# Patient Record
Sex: Male | Born: 1935 | Race: White | Hispanic: No | Smoking: Never smoker
Health system: Southern US, Community
[De-identification: ages and names within clinical notes are randomized; demographics above are authoritative.]

## PROBLEM LIST (undated history)

## (undated) DIAGNOSIS — I4891 Unspecified atrial fibrillation: Secondary | ICD-10-CM

## (undated) DIAGNOSIS — C679 Malignant neoplasm of bladder, unspecified: Secondary | ICD-10-CM

## (undated) HISTORY — PX: BACK SURGERY: SHX140

## (undated) HISTORY — DX: Unspecified atrial fibrillation: I48.91

---

## 1999-01-02 ENCOUNTER — Ambulatory Visit (HOSPITAL_BASED_OUTPATIENT_CLINIC_OR_DEPARTMENT_OTHER): Admission: RE | Admit: 1999-01-02 | Discharge: 1999-01-02 | Payer: Self-pay | Admitting: Otolaryngology

## 1999-04-01 ENCOUNTER — Encounter: Payer: Self-pay | Admitting: Internal Medicine

## 1999-04-01 ENCOUNTER — Emergency Department (HOSPITAL_COMMUNITY): Admission: EM | Admit: 1999-04-01 | Discharge: 1999-04-01 | Payer: Self-pay | Admitting: Emergency Medicine

## 1999-04-04 ENCOUNTER — Ambulatory Visit (HOSPITAL_COMMUNITY): Admission: RE | Admit: 1999-04-04 | Discharge: 1999-04-04 | Payer: Self-pay | Admitting: Gastroenterology

## 2001-07-14 ENCOUNTER — Ambulatory Visit (HOSPITAL_COMMUNITY): Admission: RE | Admit: 2001-07-14 | Discharge: 2001-07-14 | Payer: Self-pay | Admitting: *Deleted

## 2002-07-07 ENCOUNTER — Ambulatory Visit (HOSPITAL_COMMUNITY): Admission: RE | Admit: 2002-07-07 | Discharge: 2002-07-07 | Payer: Self-pay | Admitting: Neurosurgery

## 2002-07-07 ENCOUNTER — Encounter: Payer: Self-pay | Admitting: Neurosurgery

## 2007-11-19 ENCOUNTER — Encounter: Admission: RE | Admit: 2007-11-19 | Discharge: 2007-11-19 | Payer: Self-pay | Admitting: Internal Medicine

## 2007-11-25 ENCOUNTER — Encounter: Admission: RE | Admit: 2007-11-25 | Discharge: 2007-11-25 | Payer: Self-pay | Admitting: Internal Medicine

## 2008-02-16 ENCOUNTER — Encounter: Admission: RE | Admit: 2008-02-16 | Discharge: 2008-02-16 | Payer: Self-pay | Admitting: Anesthesiology

## 2008-06-02 ENCOUNTER — Inpatient Hospital Stay (HOSPITAL_COMMUNITY): Admission: RE | Admit: 2008-06-02 | Discharge: 2008-06-04 | Payer: Self-pay | Admitting: Neurological Surgery

## 2011-03-12 NOTE — Op Note (Signed)
NAMENATNAEL, BIEDERMAN             ACCOUNT NO.:  [id number]   MEDICAL RECORD NO.:  [id number]          PATIENT TYPE:  INP   LOCATION:  3007                         FACILITY:  [facility]   PHYSICIAN:  Tia Alert, MD     DATE OF BIRTH:  [birth year]   DATE OF PROCEDURE:  DATE OF DISCHARGE:                               OPERATIVE REPORT   PREOPERATIVE DIAGNOSIS:  Lumbar wound hematoma status post three-level  decompressive lumbar laminectomy.   POSTOPERATIVE DIAGNOSIS:  Lumbar wound hematoma status post three-level  decompressive lumbar laminectomy.   PROCEDURE:  Lumbar re-exploration with evacuation of superficial wound  hematoma.   SURGEON:  Tia Alert, MD   ANESTHESIA:  General endotracheal.   COMPLICATIONS:  None apparent.   INDICATIONS FOR THE PROCEDURE:  Mr. Ballentine underwent a three-level  laminectomy earlier this morning.  When he got to his room, he was noted  to have some swelling and some leakage of blood around his large Hemovac  drain that was placed.  There seemed to be bleeding around the drain  itself and he was developing swelling in the subcutaneous tissues that  was ballottable.  I recommended a lumbar re-exploration with removal of  what I suspected to be a superficial hematoma.  He had no neurologic  problems at that time with no evidence of weakness or numbness and no  real leg pain other than some mild cramping of the calves.  He  understood the risks versus benefits, expected outcome, and wished to  proceed.   DESCRIPTION OF PROCEDURE:  The patient was taken to the operating room,  and after induction of adequate general endotracheal anesthesia, he was  rolled in the prone position on chest rolls and all pressure points were  padded.  His lumbar region was prepped with DuraPrep and then draped in  the usual sterile fashion.  The old Hemovac drain was removed and the  Steri-Strips were removed just prior to prepping.  The incision was  opened and  sutures were removed.  There was a large superficial hematoma  that was suprafascial, it did go into the subfascial space, but not all  the way to the dura.  The Gelfoam was removed.  The dura looked good.  I  was able to coagulate a large amount of bleeding from the entrance hole  where the drain went into the muscle and I think this it was the source  of the bleeding, was the drain pathway.  This was coagulated.  I then  spent considerable time just drying up any muscular bleeding that I  found, waxing bone edges and making sure that I did not have significant  epidural bleeding.  I then irrigated with saline solution containing  bacitracin, lined the dura with Gelfoam once again, then spent more  considerable time drying the muscle.  I then closed the muscle with  interrupted 0 Vicryl after two medium Hemovac drains were placed in the  epidural space.  I then closed the fascia with interrupted 0 Vicryl  closing subcutaneous and subcuticular tissue with 2-0 and 3-0 Vicryl and  the skin  with Benzoin and Steri-Strips.  Drapes were removed.  A sterile dressing was applied.  The patient was  awakened from general anesthesia and transferred to the recovery room in  stable condition.  At the end of this procedure, all sponge, needle, and  instrument counts were correct.      Tia Alert, MD  Electronically Signed     DSJ/MEDQ  D:  06/02/2008  T:  06/03/2008  Job:  [id number]

## 2011-03-12 NOTE — Op Note (Signed)
Terry Sullivan, Terry Sullivan             ACCOUNT NO.:  [id number]   MEDICAL RECORD NO.:  [id number]          PATIENT TYPE:  INP   LOCATION:  3007                         FACILITY:  [facility]   PHYSICIAN:  Tia Alert, MD     DATE OF BIRTH:  December 28, 1923   DATE OF PROCEDURE:  06/02/2008  DATE OF DISCHARGE:                               OPERATIVE REPORT   PREOPERATIVE DIAGNOSIS:  Lumbar spinal stenosis L2-3, L3-4, and L4-5  with back and leg pain.   POSTOPERATIVE DIAGNOSIS:  Lumbar spinal stenosis L2-3, L3-4, and L4-5  with back and leg pain.   PROCEDURE:  Lumbar decompressive laminectomy L2-3, L3-4 and L4-5 with  medial facetectomies and foraminotomies for decompression of the L2, L3,  L4 and L5 nerve roots bilaterally.   SURGEON:  Tia Alert, MD   ASSISTANT:  Donalee Citrin, MD   ANESTHESIA:  General endotracheal.   COMPLICATIONS:  None apparent.   INDICATIONS FOR THE PROCEDURE:  Terry Sullivan is an 77 year old gentleman  who presented with severe back pain with bilateral leg pain limiting his  ability to ambulate.  He had an MRI which showed severe spondylosis with  degenerative disk disease with neural foraminal stenosis and significant  lateral recess stenosis and central canal stenosis and recommended  lumbar decompressive laminectomy at L2 through L3-4, and L4-5.  He  understood the risks, benefits, expected outcome and wished to proceed.   DESCRIPTION OF PROCEDURE:  The patient was taken to operating room and  after induction of adequate generalized endotracheal anesthesia he was  rolled into prone position on the Wilson frame and all pressure points  were padded.  His lumbar region was prepped with DuraPrep and then  draped in the usual sterile fashion.  Local anesthesia 10 mL was  injected and a dorsal midline incision was made and carried down to the  lumbosacral fascia.  The fascia was opened and the paraspinous  musculature was taken down in subperiosteal fashion to expose  L2-3, L3-  4, and L4-5.  Intraoperative x-ray confirmed my level and then I used  the Leksell rongeur to remove the spinous process of L2, L3, and L4 and  then used the Kerrison punch and a high-speed drill to perform  laminectomy and medial facetectomies at L2-3, L3-4, and L4-5.  He had  significant lateral recess stenosis at L2-3 from ligamentum flavum  overgrowth of facet hypertrophy.  He had significant lateral recess  stenosis at L3-4 and L4-5 on the right side from ligamentum flavum  hypertrophy and facet hypertrophy.  He had scar tissue down over the L5  nerve root from his previous surgery.  I spent considerable time  decompressing under the lateral recess undercutting the facets with the  Kerrison punches removing yellow ligament and overgrown facet of S1  along and identified the L2, L3, L4 and L5 nerve roots bilaterally and  had them well-decompressed into foramina as distal as I could go without  performing facetectomies.  I then palpated with a coronary dilator along  each nerve root to assure adequate compression.  I could feel the  pedicle at  each level and feel along the nerve root.  The nerve roots  were visualized.  I felt like I had a good decompression.  He did have  some scoliosis and significant facet arthrosis.  What I felt like I had  a good central canal decompression and a good decompression of the L2  and L3 nerve roots on the right and the L2 and L3 nerve roots on the  left and the L3, L4, and L5 nerve roots on the right which was my goal  prior to surgery.  I then irrigated with saline solution and dried all  bleeding points.  I used Surgifoam.  I irrigated this away.  I then  lined the dura with Gelfoam.  I placed a large Hemovac drain through a  separate stab incision, dried the obvious bleeding points and then  closed the muscle and the fascia with 0 Vicryl closing subcutaneous and  subcuticular tissue with 2-0 and 3-0 Vicryl and closed the skin with   Benzoin and Steri-Strips.  The drapes were removed.  A sterile dressing  was applied.  The patient was awakened from general anesthesia and  transferred to recovery room in stable condition.  At the end of the  procedure, all sponge, needle and instrument counts were correct.      Tia Alert, MD  Electronically Signed     DSJ/MEDQ  D:  06/02/2008  T:  06/02/2008  Job:  [id number]

## 2011-03-15 NOTE — Discharge Summary (Signed)
Terry Sullivan, Terry Sullivan             ACCOUNT NO.:  [id number]   MEDICAL RECORD NO.:  [id number]          PATIENT TYPE:  INP   LOCATION:  3007                         FACILITY:  [facility]   PHYSICIAN:  Tia Alert, MD     DATE OF BIRTH:  [birth year]   DATE OF ADMISSION:  06/02/2008  DATE OF DISCHARGE:  06/04/2008                               DISCHARGE SUMMARY   ADMITTING DIAGNOSIS:  Lumbar spinal stenosis.   PROCEDURE:  Decompressive laminectomy for lumbar spinal stenosis.   SECONDARY PROCEDURE:  Re-exploration for wound hematoma.   HISTORY OF PRESENT ILLNESS:  Terry Sullivan is an 77 year old gentleman who  presented with back and bilateral leg pain.  He had an MRI which showed  severe spinal stenosis at L2-3, L3-4, and L4-5.  I recommended  decompressive laminectomies at each level for central canal nerve root  decompression.  He understood the risks, benefits, and expected outcome  and wished to proceed.   HOSPITAL COURSE:  The patient was admitted on June 02, 2008 and taken  to operating room where he underwent a 3-level decompressive  laminectomy.  A Hemovac drain was placed and he initially did well with  the surgery.  However, he had bleeding around Hemovac and had  significant bleeding from his incision.  He started have swelling from  the incision.  We suspected a wound hematoma.  He was taken back to the  operating room later that afternoon for wound exploration and evacuation  of the hematoma.  For details of the operative procedures, please see  the dictated operative notes.  The patient tolerated the procedure well.  He was taken to the recovery room and into the floor in stable  condition.  He did have some urinary difficulties after surgery  requiring in-and-out catheterization every 6 hours.  He had no leg pain,  numbness, tingling, or weakness.  He had good strength in his lower  extremities.  He was ambulating well.  He continued to do better with  his urination, to  the point, that on June 04, 2008, he was stable for  discharge home.  He then was able to void 250 mL but had a postvoid  residual of 460 mL.  It was then felt that a catheter could be placed  and he can be discharged home with a leg bag and a Foley.  The patient  desired discharge home and therefore, we asked for followup with the  urologist as an outpatient for removal of the Foley catheter.  The  patient was discharged home in stable condition on June 04, 2008, with  plans for followup with Dr. Yetta Barre in 2 weeks.   FINAL DIAGNOSIS:  Decompressive laminectomy for lumbar spinal stenosis.      Tia Alert, MD  Electronically Signed     DSJ/MEDQ  D:  07/06/2008  T:  07/07/2008  Job:  [id number] [id number]

## 2011-07-26 LAB — CBC
RBC: 4.28
WBC: 4.1

## 2011-07-26 LAB — DIFFERENTIAL
Basophils Relative: 1
Lymphocytes Relative: 21
Lymphs Abs: 0.9
Monocytes Relative: 6
Neutro Abs: 2.8
Neutrophils Relative %: 69

## 2011-07-26 LAB — BASIC METABOLIC PANEL
Calcium: 8.9
Creatinine, Ser: 0.98
GFR calc Af Amer: >60
GFR calc non Af Amer: >60
Sodium: 139

## 2011-07-26 LAB — APTT: aPTT: 35

## 2011-07-26 LAB — PROTIME-INR: INR: 1.1

## 2011-11-11 ENCOUNTER — Other Ambulatory Visit: Payer: Self-pay | Admitting: Internal Medicine

## 2011-11-11 ENCOUNTER — Ambulatory Visit
Admission: RE | Admit: 2011-11-11 | Discharge: 2011-11-11 | Disposition: A | Discharge status: Home / Self Care | Payer: Medicare Other | Source: Ambulatory Visit | Attending: Internal Medicine | Admitting: Internal Medicine

## 2011-11-11 DIAGNOSIS — R109 Unspecified abdominal pain: Secondary | ICD-10-CM

## 2011-11-11 MED ORDER — IOHEXOL 300 MG/ML  SOLN: 30.0000 mL | Freq: Once | INTRAMUSCULAR | Status: DC | PRN

## 2011-11-11 MED ORDER — IOHEXOL 300 MG/ML  SOLN
100.0000 mL | Freq: Once | INTRAMUSCULAR | Status: AC | PRN
  Administered 2011-11-11: 100 mL via INTRAVENOUS

## 2011-11-11 MED ORDER — IOHEXOL 300 MG/ML  SOLN
30.0000 mL | Freq: Once | INTRAMUSCULAR | Status: AC | PRN
  Administered 2011-11-11: 30 mL via ORAL

## 2013-10-27 ENCOUNTER — Other Ambulatory Visit: Payer: Self-pay | Admitting: Internal Medicine

## 2013-10-27 ENCOUNTER — Ambulatory Visit
Admission: RE | Admit: 2013-10-27 | Discharge: 2013-10-27 | Disposition: A | Discharge status: Home / Self Care | Payer: Medicare Other | Source: Ambulatory Visit | Attending: Internal Medicine | Admitting: Internal Medicine

## 2013-10-27 DIAGNOSIS — R05 Cough: Secondary | ICD-10-CM

## 2014-02-14 ENCOUNTER — Other Ambulatory Visit: Payer: Self-pay | Admitting: Ophthalmology

## 2016-06-05 ENCOUNTER — Telehealth: Payer: Self-pay

## 2016-06-05 NOTE — Telephone Encounter (Signed)
DOD call from Dr. Deforest Hoyles [place]), patient's PCP. Patient has new Atrial Fib. Dr. Deforest Hoyles is starting patient on blood thinner and checking lab work. Patient needs a new patient appointment for new Atrial Fib.

## 2016-06-14 ENCOUNTER — Ambulatory Visit: Payer: Medicare Other | Admitting: Cardiovascular Disease

## 2016-07-15 ENCOUNTER — Ambulatory Visit: Payer: Self-pay | Admitting: Cardiovascular Disease

## 2017-04-08 ENCOUNTER — Other Ambulatory Visit: Payer: Self-pay | Admitting: Physical Medicine and Rehabilitation

## 2017-04-08 DIAGNOSIS — M545 Low back pain: Secondary | ICD-10-CM

## 2017-04-19 ENCOUNTER — Ambulatory Visit
Admission: RE | Admit: 2017-04-19 | Discharge: 2017-04-19 | Disposition: A | Discharge status: Home / Self Care | Payer: Medicare Other | Source: Ambulatory Visit | Attending: Physical Medicine and Rehabilitation | Admitting: Physical Medicine and Rehabilitation

## 2017-04-19 DIAGNOSIS — M545 Low back pain: Secondary | ICD-10-CM

## 2017-04-19 MED ORDER — GADOBENATE DIMEGLUMINE 529 MG/ML IV SOLN
15.0000 mL | Freq: Once | INTRAVENOUS | Status: AC | PRN
  Administered 2017-04-19: 14 mL via INTRAVENOUS

## 2018-12-25 DIAGNOSIS — M19041 Primary osteoarthritis, right hand: Secondary | ICD-10-CM | POA: Insufficient documentation

## 2018-12-25 DIAGNOSIS — M65341 Trigger finger, right ring finger: Secondary | ICD-10-CM | POA: Insufficient documentation

## 2018-12-25 DIAGNOSIS — M19042 Primary osteoarthritis, left hand: Secondary | ICD-10-CM | POA: Insufficient documentation

## 2019-03-08 ENCOUNTER — Telehealth: Payer: Self-pay | Admitting: Oncology

## 2019-03-08 NOTE — Telephone Encounter (Signed)
Received a new patient referral from Dr. [name] at [facility] for a bladder tumor. Pt has been cld and scheduled to see Dr. [name] on 5/15 at 2pm. Pt aware to arrive 15 minutes early and aware of the no visitor policy.

## 2019-03-12 ENCOUNTER — Inpatient Hospital Stay: Payer: Medicare Other | Attending: Oncology | Admitting: Oncology

## 2019-03-12 ENCOUNTER — Other Ambulatory Visit: Payer: Self-pay

## 2019-03-12 VITALS — BP 118/66 | HR 77 | Temp 98.7°F | Resp 17 | Ht 67.0 in | Wt 159.4 lb

## 2019-03-12 DIAGNOSIS — C679 Malignant neoplasm of bladder, unspecified: Secondary | ICD-10-CM

## 2019-03-12 DIAGNOSIS — I4891 Unspecified atrial fibrillation: Secondary | ICD-10-CM | POA: Diagnosis not present

## 2019-03-12 DIAGNOSIS — Z7901 Long term (current) use of anticoagulants: Secondary | ICD-10-CM | POA: Diagnosis not present

## 2019-03-12 DIAGNOSIS — M549 Dorsalgia, unspecified: Secondary | ICD-10-CM

## 2019-03-12 DIAGNOSIS — N1330 Unspecified hydronephrosis: Secondary | ICD-10-CM

## 2019-03-12 DIAGNOSIS — F5089 Other specified eating disorder: Secondary | ICD-10-CM

## 2019-03-12 DIAGNOSIS — G8929 Other chronic pain: Secondary | ICD-10-CM

## 2019-03-12 NOTE — Progress Notes (Signed)
Reason for the request:    Bladder cancer  HPI: I was asked by Dr. [name] to evaluate Terry Sullivan for new finding of a bladder tumor.  He is a 85 year old man with history of atrial fibrillation who presented with gross hematuria referred by Dr. Laurann Montana to Dr. [name].  He underwent a CT scan of the abdomen and pelvis done on May 5 of 2020 which showed a large irregular bladder mass with probable extension beyond the bladder margin in the perivesicular space.  It also extends along the left Novick sidewall with probable invasion into the left seminal vesicle.  Bladder mass involves the left UVJ with mild left hydronephrosis.  No evidence of metastatic disease noted at the time.  He underwent a cystoscopy performed in the office which showed severe trabeculation of the bladder with left lateral wall tumor measuring more than 3 cm.  No biopsies were obtained.  Based on these findings he was referred to me for evaluation.  Clinically, Terry Sullivan has reported few complaints.  Including continued urinary incontinence although no hematuria.  He also reported some abdominal distention and discomfort at times with anorexia.  He has lost 2 pounds but still able to eat.  He still ambulating with the help of a walker without any recent falls or syncope.  Formal status appears to be slowly declining.  He is daughter is currently staying with him for the last few weeks and he is looking to have more in-home help moving forward.  He does report chronic pain in his lower back which is related to arthritis and unchanged as of late.  He does not report any headaches, blurry vision, syncope or seizures. Does not report any fevers, chills or sweats.  Does not report any cough, wheezing or hemoptysis.  Does not report any chest pain, palpitation, orthopnea or leg edema.  Does not report any nausea, vomiting or abdominal pain.  Does not report any constipation or diarrhea.  Does not report any pathological fractures or changes  in his bone pain. Does not report any skin rashes or lesions. Does not report any heat or cold intolerance.  Does not report any lymphadenopathy or petechiae.  Does not report any anxiety or depression.  Remaining review of systems is negative.    Past medical history significant for atrial fibrillation, arthritis as well as status post back surgery.   Medication: Zyrtec Eliquis gabapentin, OxyContin and Flonase.    No known allergies.    Social History   Socioeconomic History  . Marital status: Married    Spouse name: Not on file  . Number of children: Not on file  . Years of education: Not on file  . Highest education level: Not on file  Occupational History  . Not on file  Social Needs  . Financial resource strain: Not on file  . Food insecurity:    Worry: Not on file    Inability: Not on file  . Transportation needs:    Medical: Not on file    Non-medical: Not on file  Tobacco Use  . Smoking status: Not on file  Substance and Sexual Activity  . Alcohol use: Not on file  . Drug use: Not on file  . Sexual activity: Not on file  Lifestyle  . Physical activity:    Days per week: Not on file    Minutes per session: Not on file  . Stress: Not on file  Relationships  . Social connections:    Talks on phone:  Not on file    Gets together: Not on file    Attends religious service: Not on file    Active member of club or organization: Not on file    Attends meetings of clubs or organizations: Not on file    Relationship status: Not on file  . Intimate partner violence:    Fear of current or ex partner: Not on file    Emotionally abused: Not on file    Physically abused: Not on file    Forced sexual activity: Not on file  Other Topics Concern  . Not on file  Social History Narrative  . Not on file  :  Pertinent items are noted in HPI.  Exam: Blood pressure 118/66, pulse 77, temperature 98.7 F (37.1 C), temperature source Oral, resp. rate 17, height 5\' 7"   (1.702 m), weight 159 lb 6.4 oz (72.3 kg), SpO2 97 %. ECOG 2  General appearance: alert and cooperative appeared without distress. Head: atraumatic without any abnormalities. Eyes: conjunctivae/corneas clear. PERRL.  Sclera anicteric. Throat: lips, mucosa, and tongue normal; without oral thrush or ulcers. Resp: clear to auscultation bilaterally without rhonchi, wheezes or dullness to percussion. Cardio: regular rate and rhythm, S1, S2 normal, no murmur, click, rub or gallop GI: soft, non-tender; bowel sounds normal; no masses,  no organomegaly Skin: Skin color, texture, turgor normal. No rashes or lesions Lymph nodes: Cervical, supraclavicular, and axillary nodes normal. Neurologic: Grossly normal without any motor, sensory or deep tendon reflexes. Musculoskeletal: No joint deformity or effusion.  CBC    Component Value Date/Time   WBC 4.1 05/30/2008 1007   RBC 4.28 05/30/2008 1007   HGB 12.6 (L) 05/30/2008 1007   HCT 37.7 (L) 05/30/2008 1007   PLT 129 (L) 05/30/2008 1007   MCV 88.3 05/30/2008 1007   MCHC 33.4 05/30/2008 1007   RDW 16.6 (H) 05/30/2008 1007   LYMPHSABS 0.9 05/30/2008 1007   MONOABS 0.2 05/30/2008 1007   EOSABS 0.1 05/30/2008 1007   BASOSABS 0.0 05/30/2008 1007     Chemistry      Component Value Date/Time   NA 139 05/30/2008 1007   K 4.3 05/30/2008 1007   CL 110 05/30/2008 1007   CO2 24 05/30/2008 1007   BUN 23 05/30/2008 1007   CREATININE 0.98 05/30/2008 1007      Component Value Date/Time   CALCIUM 8.9 05/30/2008 1007       Assessment and Plan:   85 year old man with the following:  1.  Bladder tumor diagnosed in May 2020.  He presented with hematuria and a CT scan showed a large mass extending along the upper anterior left part of the urinary bladder measuring 2.7 cm in thickness with soft tissue thickening tracking along the radial margin and the perivesical space.  No local adenopathy noted at this time.  The nature of these findings and MD  differential diagnosis was reviewed today.  These findings are suspicious for locally advanced bladder cancer indicating at least T4 disease without any evidence of adenopathy although CT scan was obtained without contrast.  Transitional cell carcinoma of the bladder is the most likely pathology but other etiologies including squamous cell carcinoma or adenocarcinoma would be possibilities.  Lymphoproliferative disorder such as lymphoma would be considered less likely.  To complete the work-up he will require a full body PET CT scan to fully evaluate these findings as well as tissue biopsy.  Obtaining tissue biopsy with a TURBT if it can be done would be ideal but if  not, percutaneous biopsy would be an alternative option.  Once these findings are available the treatment plan cannot be outlined more clearly.  Options of therapy were reviewed and I feel he has local regional disease only, would favor radiation concomitantly with chemotherapy for disease control as the preferred modality.  He has widespread disease, then palliative chemotherapy versus immunotherapy versus supportive care only would be his basis.  At this time I will arrange for PET scan in the immediate future and also will discuss with Dr. [name] about obtaining tissue biopsy.  If that cannot be done, then percutaneous biopsy would be the next step.  I will also refer him to radiation oncology for an evaluation.  He would be a marginal candidate for combination chemotherapy but would be a reasonable candidate for low-dose chemotherapy with palliative radiation approach.  2.  Chronic anticoagulation with Eliquis: I have no objections to continuing.  This can be held for any procedure for at least 2 to 3 days.  3.  Hydronephrosis: We will assess his kidney function prior to any attempt for chemotherapy.  4.  Pain: His pain is predominantly in his back which is chronic in nature.  Although he is experiencing some abdominal discomfort  related to his tumor.  5.  Anorexia: We have discussed strategies to boost his appetite including using nutritional supplements.  6.  Goals of care and prognosis: I do not believe his disease is curable at this time.  Any treatment at this time would be likely palliative to control his disease and prevent future symptoms.  Complications such as hematuria and pain would be a possibility if his disease left untreated.   7.  Follow-up: We will be determined depending on results of his work-up.  60  minutes was spent with the patient face-to-face today.  More than 50% of time was dedicated to reviewing his disease status, imaging studies, treatment options and discussing future plan of care with his daughter via phone.   Thank you for the referral. A copy of this consult has been forwarded to the requesting physician.

## 2019-03-15 NOTE — Progress Notes (Signed)
GU Location of Tumor / Histology: bladder cancer  Terry Sullivan presented to his PCP, Dr. Laurann Montana, with gross hematuria. Dr. Laurann Montana referred patient to Dr. [name] who then referred patient to Dr. [name].   Biopsy scheduled for Thursday, Mar 18, 2019 at 0715  TURBT hasn't been scheduled yet  Past/Anticipated interventions by urology, if any: referral to Dr. [name]  Past/Anticipated interventions by medical oncology, if any: Consulted, Ordered PET (scheduled for 5/27), referred patient to Dr. [name]   Weight changes, if any: denies  Bowel/Bladder complaints, if any: IPSS 8. Continued urinary incontinence, abdominal distention with discomfort and anorexia. Denies dysuria. Reports urgency seems less than it was a few weeks ago. Denies hematuria "lately." Reports wearing depend OTC.  Nausea/Vomiting, if any: denies  Pain issues, if any:  Chronic low back pain related to arthritis and two spine surgeries. Reports dull abdominal discomfort present since late April.   SAFETY ISSUES:  Prior radiation? denies  Pacemaker/ICD? denies  Possible current pregnancy? no, male patient  Is the patient on methotrexate? denies  Current Complaints / other details:  85 year old male. Married. NKDA. Has Afib and taking Eliquis. Reports feeling weaker these last few weeks.

## 2019-03-16 ENCOUNTER — Other Ambulatory Visit: Payer: Self-pay | Admitting: Cardiology

## 2019-03-16 ENCOUNTER — Encounter: Payer: Self-pay | Admitting: Radiation Oncology

## 2019-03-16 ENCOUNTER — Other Ambulatory Visit: Payer: Self-pay | Admitting: Radiology

## 2019-03-16 ENCOUNTER — Other Ambulatory Visit: Payer: Self-pay

## 2019-03-16 ENCOUNTER — Ambulatory Visit
Admission: RE | Admit: 2019-03-16 | Discharge: 2019-03-16 | Disposition: A | Discharge status: Home / Self Care | Payer: Medicare Other | Source: Ambulatory Visit | Attending: Radiation Oncology | Admitting: Radiation Oncology

## 2019-03-16 ENCOUNTER — Ambulatory Visit (HOSPITAL_COMMUNITY)
Admission: RE | Admit: 2019-03-16 | Discharge: 2019-03-16 | Disposition: A | Discharge status: Home / Self Care | Payer: Medicare Other | Source: Ambulatory Visit | Attending: Oncology | Admitting: Oncology

## 2019-03-16 VITALS — Ht 67.0 in | Wt 159.0 lb

## 2019-03-16 DIAGNOSIS — C673 Malignant neoplasm of anterior wall of bladder: Secondary | ICD-10-CM

## 2019-03-16 DIAGNOSIS — Z79899 Other long term (current) drug therapy: Secondary | ICD-10-CM | POA: Diagnosis not present

## 2019-03-16 DIAGNOSIS — C679 Malignant neoplasm of bladder, unspecified: Secondary | ICD-10-CM | POA: Diagnosis present

## 2019-03-16 HISTORY — DX: Malignant neoplasm of bladder, unspecified: C67.9

## 2019-03-16 LAB — GLUCOSE, CAPILLARY: Glucose-Capillary: 105 mg/dL — ABNORMAL HIGH (ref 70–99)

## 2019-03-16 MED ORDER — FLUDEOXYGLUCOSE F - 18 (FDG) INJECTION
7.9600 | Freq: Once | INTRAVENOUS | Status: AC | PRN
  Administered 2019-03-16: 08:00:00 7.96 via INTRAVENOUS

## 2019-03-16 NOTE — Progress Notes (Signed)
Radiation Oncology         [phone] ________________________________  Initial Outpatient Consultation - Conducted via WebEx due to current COVID-19 concerns for limiting patient exposure  Name: Terry Sullivan MRN: [id number]  Date of Service: 03/16/2019 DOB: [birth year]  [id number], Terry Reichmann, MD  Wyatt Portela, MD   REFERRING PHYSICIAN: Wyatt Portela, MD  DIAGNOSIS: 85 y.o. 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 Malignant neoplasm of anterior wall of urinary bladder ([place]) C67.3     HISTORY OF PRESENT ILLNESS: Terry Sullivan is a 85 y.o. male seen at the request of Dr. [name]. He initially presented to his PCP with worsening gross hematuria.  He was treated for potential UTI with a course of Cipro and the hematuria did temporarily improve.  However, the gross hematuria returned shortly after completing the Cipro and he was then referred to urology for further evaluation.  He initially met with Jiles Crocker, NP and underwent a CT scan of the abdomen/pelvis on 03/02/2019. This showed a large, irregular bladder mass with probable extension beyond the bladder margins in the prevesical space along the urachus, and along the left pelvic sidewall with probably invasion of the left seminal vesicle. The bladder mass appeared to involve the left UVJ, causing mild left hydronephrosis. Otherwise, no overt adenopathy or definite metastatic lesions were noted.  He met with Dr. [name] in consultation on 03/04/2019 and underwent an in-office cystoscopy which showed severe trabeculation of the bladder with left lateral wall tumor measuring more than 3 cm. The appearance was most consistent with an aggressive transitional cell carcinoma with extension into the left seminal vesicle and left ureter consistent with locally advanced disease.  Dr. [name] felt that the risks associated with TURBT outweighed the potential small  benefit as this would not have any significant impact on long-term control of his disease or improve his overall survival.  He was therefore referred to Dr. [name] for consideration of palliative chemotherapy in the management of his disease.    He met in consult with Dr. [name] on 03/12/2019 and the recommendation was to proceed with a PET scan for further assessment of the extent of metastatic disease and consideration for bladder biopsy for tissue confirmation.  If his disease is found to be locally advanced only and there is no evidence for diffuse metastasis, he is felt to be a suitable candidate for concurrent chemoradiation with the intent of achieving more durable, long-term control of his disease.  The patient understands that this is unlikely to cure his disease and would be used as a palliative treatment approach.     PET scan was performed earlier today and confirmed the known large mass involving the urinary bladder and invading the surrounding ventral, left lateral, and left posterior soft tissues as well as a single borderline left pelvic sidewall lymph node measuring 1 cm and exhibiting mild increased uptake with SUV max of 4.33.  There were no additional enlarged or FDG avid lymph nodes within the neck, chest, abdomen, or pelvis to indicate distant metastasis.  He is scheduled for CT guided bladder biopsy on Thursday May 21st.  The patient has kindly been referred today for discussion of potential radiation treatment options.  His son, Zenia Resides, and daughter are present with him during our discussion today  PREVIOUS RADIATION THERAPY: No  PAST MEDICAL HISTORY:  Past Medical History:  Diagnosis Date  . Bladder cancer ([place])  PAST SURGICAL HISTORY: Past Surgical History:  Procedure Laterality Date  . BACK SURGERY      FAMILY HISTORY:  Family History  Problem Relation Age of Onset  . Hodgkin's lymphoma Mother   . Bladder Cancer Father   . Cancer Brother     SOCIAL HISTORY:   Social History   Socioeconomic History  . Marital status: Married    Spouse name: Not on file  . Number of children: Not on file  . Years of education: Not on file  . Highest education level: Not on file  Occupational History  . Not on file  Social Needs  . Financial resource strain: Not on file  . Food insecurity:    Worry: Not on file    Inability: Not on file  . Transportation needs:    Medical: Not on file    Non-medical: Not on file  Tobacco Use  . Smoking status: Never Smoker  . Smokeless tobacco: Never Used  Substance and Sexual Activity  . Alcohol use: Not Currently  . Drug use: Never  . Sexual activity: Not Currently  Lifestyle  . Physical activity:    Days per week: Not on file    Minutes per session: Not on file  . Stress: Not on file  Relationships  . Social connections:    Talks on phone: Not on file    Gets together: Not on file    Attends religious service: Not on file    Active member of club or organization: Not on file    Attends meetings of clubs or organizations: Not on file    Relationship status: Not on file  . Intimate partner violence:    Fear of current or ex partner: Not on file    Emotionally abused: Not on file    Physically abused: Not on file    Forced sexual activity: Not on file  Other Topics Concern  . Not on file  Social History Narrative  . Not on file    ALLERGIES: Patient has no known allergies.  MEDICATIONS:  Current Outpatient Medications  Medication Sig Dispense Refill  . acetaminophen (TYLENOL) 500 MG tablet Take 500 mg by mouth 2 (two) times daily.    . cetirizine (ZYRTEC) 10 MG tablet Take by mouth as needed.    . doxazosin (CARDURA) 8 MG tablet Take 4 mg by mouth daily.    . fluticasone (FLONASE) 50 MCG/ACT nasal spray INSTILL 2 SPRAYS INTO EACH NOSTRIL ONCE DAILY IF NEEDED    . gabapentin (NEURONTIN) 100 MG capsule Take 1 capsule by mouth 2 (two) times daily.    . OXYCONTIN 10 MG 12 hr tablet Take 1 tablet by  mouth 2 (two) times daily.    Marland Kitchen tobramycin-dexamethasone (TOBRADEX) ophthalmic solution Instill one drop OD QID x 5 days    . ELIQUIS 2.5 MG TABS tablet TAKE 1 TABLET BY MOUTH TWICE DAILY (Patient not taking: Reported on 03/16/2019) 60 tablet 6   No current facility-administered medications for this encounter.     REVIEW OF SYSTEMS:  On review of systems, the patient reports that he is doing well overall. He denies any chest pain, shortness of breath, cough, fevers, chills, night sweats, or unintended weight changes. He reports feeling weaker over the past few weeks. He denies any bowel disturbances, and denies nausea or vomiting. His IPSS score was 8, indicating moderate urinary symptoms. He reports continued urinary incontinence, requiring Depends, and abdominal distension with dull discomfort in the lower abdomen since  the time of his cystoscopy. He denies dysuria or recent hematuria and reports that his urinary urgency is gradually improving. He reports chronic low back pain related to arthritis and two spine surgeries, but denies any new musculoskeletal or joint aches or pains. A complete review of systems is obtained and is otherwise negative.    PHYSICAL EXAM:  Wt Readings from Last 3 Encounters:  03/16/19 159 lb (72.1 kg)  03/12/19 159 lb 6.4 oz (72.3 kg)   Temp Readings from Last 3 Encounters:  03/12/19 98.7 F (37.1 C) (Oral)   BP Readings from Last 3 Encounters:  03/12/19 118/66   Pulse Readings from Last 3 Encounters:  03/12/19 77   Pain Assessment Pain Score: 2  Pain Frequency: Constant Pain Loc: Abdomen/10  In general this is a well appearing caucasian male in no acute distress. He is alert and oriented x4 and appropriate throughout the examination. Cardiopulmonary assessment is negative for acute distress and he exhibits normal effort. Remainder of exam not performed in light of virtual consultation platform.   KPS = 80  100 - Normal; no complaints; no evidence of  disease. 90   - Able to carry on normal activity; minor signs or symptoms of disease. 80   - Normal activity with effort; some signs or symptoms of disease. 33   - Cares for self; unable to carry on normal activity or to do active work. 60   - Requires occasional assistance, but is able to care for most of his personal needs. 50   - Requires considerable assistance and frequent medical care. 58   - Disabled; requires special care and assistance. 52   - Severely disabled; hospital admission is indicated although death not imminent. 51   - Very sick; hospital admission necessary; active supportive treatment necessary. 10   - Moribund; fatal processes progressing rapidly. 0     - Dead  Karnofsky DA, Abelmann [place], Craver LS and Burchenal [facility] [phone]) The use of the nitrogen mustards in the palliative treatment of carcinoma: with particular reference to bronchogenic carcinoma Cancer 1 [id number]  LABORATORY DATA:  Lab Results  Component Value Date   WBC 4.1 05/30/2008   HGB 12.6 (L) 05/30/2008   HCT 37.7 (L) 05/30/2008   MCV 88.3 05/30/2008   PLT 129 (L) 05/30/2008   Lab Results  Component Value Date   NA 139 05/30/2008   K 4.3 05/30/2008   CL 110 05/30/2008   CO2 24 05/30/2008   No results found for: ALT, AST, GGT, ALKPHOS, BILITOT   RADIOGRAPHY: No results found.    IMPRESSION/PLAN: 1. 85 y.o. male with locally advanced, high-grade, muscle invasive urothelial carcinoma of the bladder.  Today, we talked to the patient and family about the findings and workup thus far. We discussed the natural history of locally advanced, high grade bladder cancer and general treatment with concurrent chemoradiation. We discussed the available radiation techniques, and focused on the details of logistics and delivery. We discussed radiation treatments directed to the bladder and surrounding lymph nodes.  The recommendation is to proceed with a 7-week course of daily radiotherapy concurrent with weekly  systemic chemotherapy.  We reviewed the anticipated acute and late sequelae associated with radiation in this setting. The patient was encouraged to ask questions that were answered to his satisfaction.  At the end of the conversation, the patient agrees to proceed with CT-guided biopsy of the bladder tumor as scheduled on Thursday May 21st. We will share our discussion with Dr. [name]  and tentatively schedule the patient for CT simulation/treatment planning for Thursday, 03/25/2019 at 2 PM. Pending biopsy results, we will discuss his case further with Dr. [name] and confirm recommendations for a 2 to 7 week course of palliative radiotherapy concurrent with systemic chemotherapy.  The patient appears to have a good understanding of his disease and our recommendations which are of palliative intent.  He is comfortable with and in agreement with the stated plan.   This encounter was provided by telemedicine platform Webex.   The patient was contacted in advance and has given verbal consent for this type of encounter.  He has been advised to only accept a meeting of this type in a secure network environment. The time spent during this encounter was 60 minutes with 50% of that time spent in review of outside records and coordination of the patient's care. The attendants for this meeting include Tyler Pita MD, Freeman Caldron PA-C, scribe Clinton Sawyer, patient, Yates Weisgerber and his son, Zenia Resides band daughter. During the encounter, Tyler Pita MD, Ashlyn Bruning PA-C, and scribe Clinton Sawyer were located at [facility] Radiation Oncology Department.  Patient, Seaborn Nakama and his son and daughter were located at home.    Nicholos Johns, PA-C    Tyler Pita, MD  [facility] Direct Dial: [phone]  Fax: [phone] [email or web address]  Skype  LinkedIn  This document serves as a record of services personally performed by Tyler Pita, MD  and Freeman Caldron, PA-C. It was created on their behalf by Rae Lips, a trained medical scribe. The creation of this record is based on the scribe's personal observations and the providers' statements to them. This document has been checked and approved by the attending providers.

## 2019-03-16 NOTE — Progress Notes (Signed)
See progress note under physician encounter. 

## 2019-03-17 ENCOUNTER — Other Ambulatory Visit: Payer: Self-pay | Admitting: Oncology

## 2019-03-17 DIAGNOSIS — C678 Malignant neoplasm of overlapping sites of bladder: Secondary | ICD-10-CM

## 2019-03-17 DIAGNOSIS — C672 Malignant neoplasm of lateral wall of bladder: Secondary | ICD-10-CM | POA: Insufficient documentation

## 2019-03-17 DIAGNOSIS — C679 Malignant neoplasm of bladder, unspecified: Secondary | ICD-10-CM | POA: Insufficient documentation

## 2019-03-17 NOTE — Progress Notes (Signed)
The results of the PET scan were discussed today with the patient via phone.  He appears to have locally advanced disease without any widespread metastasis.  His case was also discussed with Dr. [name] who evaluated patient and felt that he would be a candidate for radiation.  He is scheduled to have a tissue biopsy via percutaneous approach for confirmation of his diagnosis also for further molecular studies including PDL 1 testing for possible systemic therapy in the future.  The plan would be to proceed with concomitant chemotherapy using carboplatin with radiation for local control.  We will use immunotherapy for systemic disease control if his tumor over expresses PDL 1.

## 2019-03-17 NOTE — Progress Notes (Signed)
START OFF PATHWAY REGIMEN - Bladder   [id number]:Carboplatin AUC=2:   Administer weekly:     Carboplatin   **Always confirm dose/schedule in your pharmacy ordering system**  Patient Characteristics: Pre-Cystectomy or Nonsurgical Candidate (Clinical Staging), cT2-4a, cN0-1, M0, Cystectomy Ineligible Therapeutic Status: Pre-Cystectomy or Nonsurgical Candidate (Clinical Staging) AJCC M Category: cM0 AJCC 8 Stage Grouping: IIIA AJCC T Category: cT4a AJCC N Category: cN0 Intent of Therapy: Non-Curative / Palliative Intent, Discussed with Patient

## 2019-03-18 ENCOUNTER — Other Ambulatory Visit: Payer: Self-pay

## 2019-03-18 ENCOUNTER — Encounter (HOSPITAL_COMMUNITY): Payer: Self-pay

## 2019-03-18 ENCOUNTER — Ambulatory Visit (HOSPITAL_COMMUNITY)
Admission: RE | Admit: 2019-03-18 | Discharge: 2019-03-18 | Disposition: A | Discharge status: Home / Self Care | Payer: Medicare Other | Source: Ambulatory Visit | Attending: Oncology | Admitting: Oncology

## 2019-03-18 DIAGNOSIS — Z79899 Other long term (current) drug therapy: Secondary | ICD-10-CM | POA: Insufficient documentation

## 2019-03-18 DIAGNOSIS — Z809 Family history of malignant neoplasm, unspecified: Secondary | ICD-10-CM | POA: Insufficient documentation

## 2019-03-18 DIAGNOSIS — Z8052 Family history of malignant neoplasm of bladder: Secondary | ICD-10-CM | POA: Diagnosis not present

## 2019-03-18 DIAGNOSIS — Z807 Family history of other malignant neoplasms of lymphoid, hematopoietic and related tissues: Secondary | ICD-10-CM | POA: Insufficient documentation

## 2019-03-18 DIAGNOSIS — I700 Atherosclerosis of aorta: Secondary | ICD-10-CM | POA: Diagnosis not present

## 2019-03-18 DIAGNOSIS — I4891 Unspecified atrial fibrillation: Secondary | ICD-10-CM | POA: Insufficient documentation

## 2019-03-18 DIAGNOSIS — C679 Malignant neoplasm of bladder, unspecified: Secondary | ICD-10-CM

## 2019-03-18 DIAGNOSIS — Z7901 Long term (current) use of anticoagulants: Secondary | ICD-10-CM | POA: Diagnosis not present

## 2019-03-18 LAB — CBC WITH DIFFERENTIAL/PLATELET
Abs Immature Granulocytes: 0.03 10*3/uL (ref 0.00–0.07)
Basophils Absolute: 0.1 10*3/uL (ref 0.0–0.1)
Basophils Relative: 1 %
Eosinophils Absolute: 0.2 10*3/uL (ref 0.0–0.5)
Eosinophils Relative: 3 %
HCT: 38.5 % — ABNORMAL LOW (ref 39.0–52.0)
Hemoglobin: 13 g/dL (ref 13.0–17.0)
Immature Granulocytes: 0 %
Lymphocytes Relative: 23 %
Lymphs Abs: 1.7 10*3/uL (ref 0.7–4.0)
MCH: 33.4 pg (ref 26.0–34.0)
MCHC: 33.8 g/dL (ref 30.0–36.0)
MCV: 99 fL (ref 80.0–100.0)
Monocytes Absolute: 0.5 10*3/uL (ref 0.1–1.0)
Monocytes Relative: 7 %
Neutro Abs: 4.8 10*3/uL (ref 1.7–7.7)
Neutrophils Relative %: 66 %
Platelets: 264 10*3/uL (ref 150–400)
RBC: 3.89 MIL/uL — ABNORMAL LOW (ref 4.22–5.81)
RDW: 12.3 % (ref 11.5–15.5)
WBC: 7.3 10*3/uL (ref 4.0–10.5)
nRBC: 0 % (ref 0.0–0.2)

## 2019-03-18 LAB — COMPREHENSIVE METABOLIC PANEL
ALT: 23 U/L (ref 0–44)
AST: 22 U/L (ref 15–41)
Albumin: 4.2 g/dL (ref 3.5–5.0)
Alkaline Phosphatase: 83 U/L (ref 38–126)
Anion gap: 9 (ref 5–15)
BUN: 41 mg/dL — ABNORMAL HIGH (ref 8–23)
CO2: 26 mmol/L (ref 22–32)
Calcium: 9 mg/dL (ref 8.9–10.3)
Chloride: 103 mmol/L (ref 98–111)
Creatinine, Ser: 2.28 mg/dL — ABNORMAL HIGH (ref 0.61–1.24)
GFR calc Af Amer: 28 mL/min — ABNORMAL LOW (ref >60)
GFR calc non Af Amer: 24 mL/min — ABNORMAL LOW (ref >60)
Glucose, Bld: 106 mg/dL — ABNORMAL HIGH (ref 70–99)
Potassium: 4.2 mmol/L (ref 3.5–5.1)
Sodium: 138 mmol/L (ref 135–145)
Total Bilirubin: 0.9 mg/dL (ref 0.3–1.2)
Total Protein: 7.8 g/dL (ref 6.5–8.1)

## 2019-03-18 LAB — PROTIME-INR
INR: 1.2 (ref 0.8–1.2)
Prothrombin Time: 14.7 seconds (ref 11.4–15.2)

## 2019-03-18 MED ORDER — FENTANYL CITRATE (PF) 100 MCG/2ML IJ SOLN
INTRAMUSCULAR | Status: AC | PRN
  Administered 2019-03-18: 50 ug via INTRAVENOUS

## 2019-03-18 MED ORDER — FLUMAZENIL 0.5 MG/5ML IV SOLN
INTRAVENOUS | Status: AC
  Filled 2019-03-18: qty 5

## 2019-03-18 MED ORDER — MIDAZOLAM HCL 2 MG/2ML IJ SOLN
INTRAMUSCULAR | Status: AC
  Filled 2019-03-18: qty 2

## 2019-03-18 MED ORDER — MIDAZOLAM HCL 2 MG/2ML IJ SOLN
INTRAMUSCULAR | Status: AC | PRN
  Administered 2019-03-18: 1 mg via INTRAVENOUS

## 2019-03-18 MED ORDER — HYDROCODONE-ACETAMINOPHEN 5-325 MG PO TABS: 1.0000 | ORAL_TABLET | ORAL | Status: DC | PRN

## 2019-03-18 MED ORDER — NALOXONE HCL 0.4 MG/ML IJ SOLN
INTRAMUSCULAR | Status: AC
  Filled 2019-03-18: qty 1

## 2019-03-18 MED ORDER — LIDOCAINE HCL (PF) 1 % IJ SOLN
INTRAMUSCULAR | Status: AC | PRN
  Administered 2019-03-18: 10 mL

## 2019-03-18 MED ORDER — SODIUM CHLORIDE 0.9 % IV SOLN
INTRAVENOUS | Status: DC
  Administered 2019-03-18: 08:00:00 via INTRAVENOUS

## 2019-03-18 MED ORDER — FENTANYL CITRATE (PF) 100 MCG/2ML IJ SOLN
INTRAMUSCULAR | Status: AC
  Filled 2019-03-18: qty 2

## 2019-03-18 NOTE — Procedures (Signed)
  Procedure: CT core bladder mass   EBL:   minimal Complications:  none immediate  See full dictation in [facility].  Dillard Cannon MD Main # [phone] Pager  [phone]

## 2019-03-18 NOTE — H&P (Signed)
Chief Complaint: Patient was seen in consultation today for bladder cancer  Referring Physician(s): Wyatt Portela  Supervising Physician: Arne [place]  Patient Status: [facility] - Out-pt  History of Present Illness: Terry Sullivan is a 85 y.o. male with past medical history of a fib on Eliquis at home who presented to his PCP with hematuria.  Patient found to have a large irregular bladder mass suspicious for locally advanced bladder cancer.  IR consulted for bladder mass biopsy at the request of Dr. [name].   Patient presents for procedure today. He does complain of a dull stomach ache.  He has been NPO and not take any of his usual morning medications. He is otherwise in his usual state of health. Denies hematuria today. He has held his Eliquis since Monday.    Past Medical History:  Diagnosis Date   Bladder cancer [facility])     Past Surgical History:  Procedure Laterality Date   BACK SURGERY      Allergies: Patient has no known allergies.  Medications: Prior to Admission medications   Medication Sig Start Date End Date Taking? Authorizing Provider  acetaminophen (TYLENOL) 500 MG tablet Take 500 mg by mouth 2 (two) times daily.   Yes [provider]  cetirizine (ZYRTEC) 10 MG tablet Take by mouth as needed.   Yes [provider]  doxazosin (CARDURA) 8 MG tablet Take 4 mg by mouth daily. 03/10/19  Yes [provider]  fluticasone (FLONASE) 50 MCG/ACT nasal spray INSTILL 2 SPRAYS INTO EACH NOSTRIL ONCE DAILY IF NEEDED 03/24/18  Yes [provider]  gabapentin (NEURONTIN) 100 MG capsule Take 1 capsule by mouth 2 (two) times daily. 02/17/19  Yes [provider]  OXYCONTIN 10 MG 12 hr tablet Take 1 tablet by mouth 2 (two) times daily. 02/23/19  Yes [provider]  tobramycin-dexamethasone [facility]) ophthalmic solution Instill one drop OD QID x 5 days 05/07/18  Yes [provider]  ELIQUIS 2.5 MG TABS tablet TAKE 1  TABLET BY MOUTH TWICE DAILY Patient not taking: Reported on 03/16/2019 03/16/19   [name], MD     Family History  Problem Relation Age of Onset   Hodgkin's lymphoma Mother    Bladder Cancer Father    Cancer Brother     Social History   Socioeconomic History   Marital status: Widowed    Spouse name: Not on file   Number of children: Not on file   Years of education: Not on file   Highest education level: Not on file  Occupational History   Not on file  Social Needs   Financial resource strain: Not on file   Food insecurity:    Worry: Not on file    Inability: Not on file   Transportation needs:    Medical: Not on file    Non-medical: Not on file  Tobacco Use   Smoking status: Never Smoker   Smokeless tobacco: Never Used  Substance and Sexual Activity   Alcohol use: Not Currently   Drug use: Never   Sexual activity: Not Currently  Lifestyle   Physical activity:    Days per week: Not on file    Minutes per session: Not on file   Stress: Not on file  Relationships   Social connections:    Talks on phone: Not on file    Gets together: Not on file    Attends religious service: Not on file    Active member of club or organization: Not on  file    Attends meetings of clubs or organizations: Not on file    Relationship status: Not on file  Other Topics Concern   Not on file  Social History Narrative   Not on file     Review of Systems: A 12 point ROS discussed and pertinent positives are indicated in the HPI above.  All other systems are negative.  Review of Systems  Constitutional: Negative for fatigue and fever.  Respiratory: Negative for cough and shortness of breath.   Cardiovascular: Negative for chest pain.  Gastrointestinal: Negative for abdominal pain.  Genitourinary: Negative for hematuria.  Musculoskeletal: Negative for back pain.    Vital Signs: BP (!) 157/84    Pulse 74    Temp 98.3 F (36.8 C) (Oral)    Resp 18    SpO2  96%   Physical Exam Vitals signs and nursing note reviewed.  Constitutional:      Appearance: Normal appearance.  HENT:     Mouth/Throat:     Mouth: Mucous membranes are moist.     Pharynx: Oropharynx is clear.  Cardiovascular:     Rate and Rhythm: Normal rate and regular rhythm.     Heart sounds: No murmur. No friction rub. No gallop.   Pulmonary:     Effort: Pulmonary effort is normal.     Breath sounds: Normal breath sounds.  Abdominal:     General: Abdomen is flat.     Palpations: Abdomen is soft.  Skin:    General: Skin is warm and dry.  Neurological:     General: No focal deficit present.     Mental Status: He is alert and oriented to person, place, and time. Mental status is at baseline.  Psychiatric:        Mood and Affect: Mood normal.        Behavior: Behavior normal.        Thought Content: Thought content normal.        Judgment: Judgment normal.      MD Evaluation Airway: WNL Heart: WNL Abdomen: WNL Chest/ Lungs: WNL ASA  Classification: 3 Mallampati/Airway Score: One   Imaging: Nm Pet Image Initial (pi) Skull Base To Thigh  Result Date: 03/16/2019 CLINICAL DATA:  Initial treatment strategy for bladder cancer. EXAM: NUCLEAR MEDICINE PET SKULL BASE TO THIGH TECHNIQUE: 7.96 mCi F-18 FDG was injected intravenously. Full-ring PET imaging was performed from the skull base to thigh after the radiotracer. CT data was obtained and used for attenuation correction and anatomic localization. Fasting blood glucose: 105 mg/dl COMPARISON:  CT AP 03/02/2019 FINDINGS: Mediastinal blood pool activity: SUV max 2.75 Liver activity: SUV max NA NECK: No hypermetabolic lymph nodes in the neck. Incidental CT findings: none CHEST: No hypermetabolic axillary, supraclavicular, mediastinal or hilar lymph nodes. Pulmonary nodule in the left lower lobe measures 4 mm, image 51/8. Too small to reliably characterize. This is unchanged however from 11/11/2011. Incidental CT findings: Aortic  atherosclerosis. Lad, and left circumflex coronary artery calcifications noted. ABDOMEN/PELVIS: There is no focal abnormal areas of increased uptake within the liver, pancreas, spleen, or adrenal glands. Left pelvic sidewall lymph node measures 1 cm and has an SUV max of 4.33. The large invasive bladder mass with extension into the surround anterior, left lateral, and left posterior soft tissues is again noted as seen on 03/02/2019. The abnormal soft tissue extending into the surrounding soft tissues is FDG avid compatible with metabolically active tumor. FDG uptake associated with the bladder mass is equal to  9.25. The tumor obstructs the left ureter at the level of the UVJ resulting in left-sided hydronephrosis. Incidental CT findings: Aortic atherosclerosis. No aneurysm. No ascites within the abdomen or pelvis. SKELETON: No focal hypermetabolic activity to suggest skeletal metastasis. Incidental CT findings: none IMPRESSION: 1. Large mass involving the urinary bladder is again noted and is FDG avid compatible with primary urothelial neoplasm. The tumor invades the surrounding ventral, left lateral and left posterior soft tissues. 2. Single borderline left pelvic sidewall lymph node measures 1 cm and exhibits mild increased uptake within SUV max of 4.33. No additional enlarged or FDG avid lymph nodes within the neck, chest, abdomen or pelvis. 3. Small pulmonary nodule in the left lower lobe is too small to reliably characterize but appears unchanged from 11/11/2011 favoring a benign process. 4.  Aortic Atherosclerosis (ICD10-I70.0). Electronically Signed   By: Kerby Moors M.D.   On: 03/16/2019 15:04    Labs:  CBC: Recent Labs    03/18/19 0801  WBC 7.3  HGB 13.0  HCT 38.5*  PLT 264    COAGS: Recent Labs    03/18/19 0801  INR 1.2    BMP: Recent Labs    03/18/19 0801  NA 138  K 4.2  CL 103  CO2 26  GLUCOSE 106*  BUN 41*  CALCIUM 9.0  CREATININE 2.28*  GFRNONAA 24*  GFRAA 28*     LIVER FUNCTION TESTS: Recent Labs    03/18/19 0801  BILITOT 0.9  AST 22  ALT 23  ALKPHOS 83  PROT 7.8  ALBUMIN 4.2    TUMOR MARKERS: No results for input(s): AFPTM, CEA, CA199, CHROMGRNA in the last 8760 hours.  Assessment and Plan: Patient with past medical history of a fib presents with complaint of hematuria, bladder mass.  IR consulted for bladder mass biopsy at the request of Dr. [name]. Case reviewed by Dr. Vernard Gambles who approves patient for procedure.  Patient presents today in their usual state of health.  He has been NPO and is not currently on blood thinners as he appropriately held his Eliquis.   Risks and benefits of biopsy were discussed with the patient and/or patient's family including, but not limited to bleeding, infection, damage to adjacent structures or low yield requiring additional tests.  All of the questions were answered and there is agreement to proceed.  Consent signed and in chart.  Thank you for this interesting consult.  I greatly enjoyed meeting Ryaan Mateo and look forward to participating in their care.  A copy of this report was sent to the requesting provider on this date.  Electronically Signed: Docia Barrier, PA 03/18/2019, 8:38 AM   I spent a total of  30 Minutes   in face to face in clinical consultation, greater than 50% of which was counseling/coordinating care for bladder mass.

## 2019-03-18 NOTE — Discharge Instructions (Signed)
Moderate Conscious Sedation, Adult, Care After These instructions provide you with information about caring for yourself after your procedure. Your health care provider may also give you more specific instructions. Your treatment has been planned according to current medical practices, but problems sometimes occur. Call your health care provider if you have any problems or questions after your procedure. What can I expect after the procedure? After your procedure, it is common:  To feel sleepy for several hours.  To feel clumsy and have poor balance for several hours.  To have poor judgment for several hours.  To vomit if you eat too soon. Follow these instructions at home: For at least 24 hours after the procedure:   Do not: ? Participate in activities where you could fall or become injured. ? Drive. ? Use heavy machinery. ? Drink alcohol. ? Take sleeping pills or medicines that cause drowsiness. ? Make important decisions or sign legal documents. ? Take care of children on your own.  Rest. Eating and drinking  Follow the diet recommended by your health care provider.  If you vomit: ? Drink water, juice, or soup when you can drink without vomiting. ? Make sure you have little or no nausea before eating solid foods. General instructions  Have a responsible adult stay with you until you are awake and alert.  Take over-the-counter and prescription medicines only as told by your health care provider.  If you smoke, do not smoke without supervision.  Keep all follow-up visits as told by your health care provider. This is important. Contact a health care provider if:  You keep feeling nauseous or you keep vomiting.  You feel light-headed.  You develop a rash.  You have a fever. Get help right away if:  You have trouble breathing. This information is not intended to replace advice given to you by your health care provider. Make sure you discuss any questions you have  with your health care provider. Document Released: 08/04/2013 Document Revised: 03/18/2016 Document Reviewed: 02/03/2016 Elsevier Interactive Patient Education  2019 [employer].   Needle Biopsy, Care After These instructions tell you how to care for yourself after your procedure. Your doctor may also give you more specific instructions. Call your doctor if you have any problems or questions. What can I expect after the procedure? After the procedure, it is common to have:  Soreness.  Bruising.  Mild pain. Follow these instructions at home:   Return to your normal activities as told by your doctor. Ask your doctor what activities are safe for you.  Take over-the-counter and prescription medicines only as told by your doctor.  Wash your hands with soap and water before you change your bandage (dressing). If you cannot use soap and water, use hand sanitizer.  Follow instructions from your doctor about: ? How to take care of your puncture site. ? When and how to change your bandage. ? When to remove your bandage.  You may remove your dressing tomorrow.  Check your puncture site every day for signs of infection. Watch for: ? Redness, swelling, or pain. ? Fluid or blood. ? Pus or a bad smell. ? Warmth.  Do not take baths, swim, or use a hot tub until your doctor approves. Ask your doctor if you may take showers. You may only be allowed to take sponge baths.  You may shower tomorrow.  Keep all follow-up visits as told by your doctor. This is important. Contact a doctor if you have:  A fever.  Redness, swelling, or pain at the puncture site, and it lasts longer than a few days.  Fluid, blood, or pus coming from the puncture site.  Warmth coming from the puncture site. Get help right away if:  You have a lot of bleeding from the puncture site. Summary  After the procedure, it is common to have soreness, bruising, or mild pain at the puncture site.  Check your puncture  site every day for signs of infection, such as redness, swelling, or pain.  Get help right away if you have severe bleeding from your puncture site. This information is not intended to replace advice given to you by your health care provider. Make sure you discuss any questions you have with your health care provider. Document Released: 09/26/2008 Document Revised: 10/27/2017 Document Reviewed: 10/27/2017 Elsevier Interactive Patient Education  2019 [employer].

## 2019-03-23 ENCOUNTER — Encounter: Payer: Self-pay | Admitting: Urology

## 2019-03-23 NOTE — Progress Notes (Signed)
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 [name].  Patient is scheduled for [facility] 03/25/19 in anticipation of beginning a 36 fraction course of bladder irradiation concurrent with carboplatin chemotherapy on 04/01/19 for palliation and local disease control. A shorter course of treatment may be considered based on patient's tolerability of treatment.  Nicholos Johns, MMS, PA-C [facility] [place]: [phone]  Fax: [phone]

## 2019-03-24 ENCOUNTER — Ambulatory Visit (HOSPITAL_COMMUNITY): Payer: Medicare Other

## 2019-03-25 ENCOUNTER — Ambulatory Visit
Admission: RE | Admit: 2019-03-25 | Discharge: 2019-03-25 | Disposition: A | Discharge status: Home / Self Care | Payer: Medicare Other | Source: Ambulatory Visit | Attending: Radiation Oncology | Admitting: Radiation Oncology

## 2019-03-25 ENCOUNTER — Other Ambulatory Visit: Payer: Self-pay

## 2019-03-25 ENCOUNTER — Inpatient Hospital Stay: Payer: Medicare Other

## 2019-03-25 DIAGNOSIS — C672 Malignant neoplasm of lateral wall of bladder: Secondary | ICD-10-CM | POA: Diagnosis not present

## 2019-03-25 DIAGNOSIS — Z510 Encounter for antineoplastic radiation therapy: Secondary | ICD-10-CM | POA: Insufficient documentation

## 2019-03-25 NOTE — Progress Notes (Signed)
  Radiation Oncology         [phone]) [id number] ________________________________  Name: Terry Sullivan MRN: [id number]  Date: 03/25/2019  DOB: Oct 22, 1924  SIMULATION AND TREATMENT PLANNING NOTE    ICD-10-CM   1. Malignant neoplasm of lateral wall of urinary bladder (HCC) C67.2     DIAGNOSIS:  85 y.o. 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  Identity was confirmed.  All relevant records and images related to the planned course of therapy were reviewed.  The patient freely provided informed written consent to proceed with treatment after reviewing the details related to the planned course of therapy. The consent form was witnessed and verified by the simulation staff.  Then, the patient was set-up in a stable reproducible  supine position for radiation therapy.  Contrast was instilled into the bladder with a catheter under sterile conditions.  CT images were obtained.  Surface markings were placed.  The CT images were loaded into the planning software.  Then the target and avoidance structures were contoured.  Treatment planning then occurred.  The radiation prescription was entered and confirmed.  Then, I designed and supervised the construction of a total of 5 medically necessary complex treatment devices including VacLoc body positioner and 4 MLCs to shield the bowel and femoral necks.  I have requested : 3D Simulation  I have requested a DVH of the following structures: small bowel, rectum, left femoral head, right femoral head and targets.  SPECIAL TREATMENT PROCEDURE:  The planned course of therapy using radiation constitutes a special treatment procedure. Special care is required in the management of this patient for the following reasons. This treatment constitutes a Special Treatment Procedure for the following reason: [ Concurrent chemotherapy requiring careful monitoring for  increased toxicities of treatment including weekly laboratory values..  The special nature of the planned course of radiotherapy will require increased physician supervision and oversight to ensure patient's safety with optimal treatment outcomes.  PLAN:  The patient will receive 45 Gy in 25 fractions to the bladder and pelvic nodes, followed by a boost to the bladder tumor to a total dose of 64.8 Gy.  ________________________________  [name] [name], M.D.

## 2019-03-26 ENCOUNTER — Telehealth: Payer: Self-pay

## 2019-03-26 ENCOUNTER — Other Ambulatory Visit: Payer: Self-pay

## 2019-03-26 MED ORDER — PROCHLORPERAZINE MALEATE 10 MG PO TABS: 10.0000 mg | ORAL_TABLET | Freq: Four times a day (QID) | ORAL | 0 refills | Status: DC | PRN

## 2019-03-26 NOTE — Telephone Encounter (Signed)
Prescription for Compazine sent to [facility].

## 2019-03-26 NOTE — Telephone Encounter (Signed)
-----   Message from Wyatt Portela, MD sent at 03/26/2019  9:30 AM EDT ----- Regarding: RE: anti-nausea med Compazine 10 mg q6. #30. Thanks.  ----- Message ----- From: Jesse Fall, RN Sent: 03/25/2019   5:49 PM EDT To: [facility] Subject: anti-nausea med                                Dr [name], If you want pt to have nausea med please have your nurse send in script.  Thanks, [employer]

## 2019-03-29 ENCOUNTER — Encounter: Payer: Self-pay | Admitting: Oncology

## 2019-04-01 ENCOUNTER — Telehealth: Payer: Self-pay | Admitting: Oncology

## 2019-04-01 ENCOUNTER — Ambulatory Visit: Payer: Medicare Other

## 2019-04-01 ENCOUNTER — Ambulatory Visit: Payer: Medicare Other | Admitting: Oncology

## 2019-04-01 ENCOUNTER — Other Ambulatory Visit: Payer: Self-pay

## 2019-04-01 ENCOUNTER — Other Ambulatory Visit: Payer: Medicare Other

## 2019-04-01 ENCOUNTER — Inpatient Hospital Stay (HOSPITAL_BASED_OUTPATIENT_CLINIC_OR_DEPARTMENT_OTHER): Payer: Medicare Other | Admitting: Oncology

## 2019-04-01 ENCOUNTER — Inpatient Hospital Stay: Payer: Medicare Other | Attending: Oncology

## 2019-04-01 ENCOUNTER — Inpatient Hospital Stay: Payer: Medicare Other

## 2019-04-01 VITALS — BP 110/62 | HR 67 | Temp 97.5°F | Resp 17 | Ht 67.0 in | Wt 156.1 lb

## 2019-04-01 DIAGNOSIS — R911 Solitary pulmonary nodule: Secondary | ICD-10-CM | POA: Diagnosis not present

## 2019-04-01 DIAGNOSIS — D6959 Other secondary thrombocytopenia: Secondary | ICD-10-CM | POA: Insufficient documentation

## 2019-04-01 DIAGNOSIS — N1330 Unspecified hydronephrosis: Secondary | ICD-10-CM | POA: Insufficient documentation

## 2019-04-01 DIAGNOSIS — R5383 Other fatigue: Secondary | ICD-10-CM | POA: Diagnosis not present

## 2019-04-01 DIAGNOSIS — Z7901 Long term (current) use of anticoagulants: Secondary | ICD-10-CM | POA: Diagnosis not present

## 2019-04-01 DIAGNOSIS — Z5111 Encounter for antineoplastic chemotherapy: Secondary | ICD-10-CM | POA: Insufficient documentation

## 2019-04-01 DIAGNOSIS — C678 Malignant neoplasm of overlapping sites of bladder: Secondary | ICD-10-CM | POA: Diagnosis not present

## 2019-04-01 DIAGNOSIS — M1990 Unspecified osteoarthritis, unspecified site: Secondary | ICD-10-CM

## 2019-04-01 DIAGNOSIS — T451X5S Adverse effect of antineoplastic and immunosuppressive drugs, sequela: Secondary | ICD-10-CM | POA: Insufficient documentation

## 2019-04-01 DIAGNOSIS — Z79899 Other long term (current) drug therapy: Secondary | ICD-10-CM

## 2019-04-01 DIAGNOSIS — R630 Anorexia: Secondary | ICD-10-CM | POA: Insufficient documentation

## 2019-04-01 DIAGNOSIS — Z7982 Long term (current) use of aspirin: Secondary | ICD-10-CM | POA: Insufficient documentation

## 2019-04-01 DIAGNOSIS — C672 Malignant neoplasm of lateral wall of bladder: Secondary | ICD-10-CM | POA: Insufficient documentation

## 2019-04-01 DIAGNOSIS — Z510 Encounter for antineoplastic radiation therapy: Secondary | ICD-10-CM | POA: Insufficient documentation

## 2019-04-01 DIAGNOSIS — I700 Atherosclerosis of aorta: Secondary | ICD-10-CM | POA: Insufficient documentation

## 2019-04-01 LAB — CMP (CANCER CENTER ONLY)
ALT: 13 U/L (ref 0–44)
AST: 18 U/L (ref 15–41)
Albumin: 3.9 g/dL (ref 3.5–5.0)
Alkaline Phosphatase: 78 U/L (ref 38–126)
Anion gap: 12 (ref 5–15)
BUN: 42 mg/dL — ABNORMAL HIGH (ref 8–23)
CO2: 21 mmol/L — ABNORMAL LOW (ref 22–32)
Calcium: 9.1 mg/dL (ref 8.9–10.3)
Chloride: 105 mmol/L (ref 98–111)
Creatinine: 2.16 mg/dL — ABNORMAL HIGH (ref 0.61–1.24)
GFR, Est AFR Am: 30 mL/min — ABNORMAL LOW (ref >60)
GFR, Estimated: 26 mL/min — ABNORMAL LOW (ref >60)
Glucose, Bld: 94 mg/dL (ref 70–99)
Potassium: 4.8 mmol/L (ref 3.5–5.1)
Sodium: 138 mmol/L (ref 135–145)
Total Bilirubin: 0.5 mg/dL (ref 0.3–1.2)
Total Protein: 7.2 g/dL (ref 6.5–8.1)

## 2019-04-01 LAB — CBC WITH DIFFERENTIAL (CANCER CENTER ONLY)
Basophils Absolute: 0.1 10*3/uL (ref 0.0–0.1)
Basophils Relative: 1 %
Eosinophils Absolute: 0.1 10*3/uL (ref 0.0–0.5)
Eosinophils Relative: 2 %
HCT: 36.2 % — ABNORMAL LOW (ref 39.0–52.0)
Hemoglobin: 12 g/dL — ABNORMAL LOW (ref 13.0–17.0)
Lymphocytes Relative: 22 %
Lymphs Abs: 1.5 10*3/uL (ref 0.7–4.0)
MCH: 31.8 pg (ref 26.0–34.0)
MCHC: 33.1 g/dL (ref 30.0–36.0)
MCV: 96 fL (ref 80.0–100.0)
Monocytes Absolute: 0.4 10*3/uL (ref 0.1–1.0)
Monocytes Relative: 7 %
Neutro Abs: 4.6 10*3/uL (ref 1.7–7.7)
Neutrophils Relative %: 68 %
Platelet Count: 178 10*3/uL (ref 150–400)
RBC: 3.77 MIL/uL — ABNORMAL LOW (ref 4.22–5.81)
RDW: 12.5 % (ref 11.5–15.5)
WBC Count: 6.7 10*3/uL (ref 4.0–10.5)
nRBC: 0 % (ref 0.0–0.2)

## 2019-04-01 MED ORDER — DEXAMETHASONE SODIUM PHOSPHATE 10 MG/ML IJ SOLN
10.0000 mg | Freq: Once | INTRAMUSCULAR | Status: AC
  Administered 2019-04-01: 14:00:00 10 mg via INTRAVENOUS

## 2019-04-01 MED ORDER — PALONOSETRON HCL INJECTION 0.25 MG/5ML
INTRAVENOUS | Status: AC
  Filled 2019-04-01: qty 5

## 2019-04-01 MED ORDER — SODIUM CHLORIDE 0.9 % IV SOLN: 10.0000 mg | Freq: Once | INTRAVENOUS | Status: DC

## 2019-04-01 MED ORDER — SODIUM CHLORIDE 0.9 % IV SOLN
Freq: Once | INTRAVENOUS | Status: AC
  Administered 2019-04-01: 14:00:00 via INTRAVENOUS
  Filled 2019-04-01: qty 250

## 2019-04-01 MED ORDER — SODIUM CHLORIDE 0.9 % IV SOLN
92.2000 mg | Freq: Once | INTRAVENOUS | Status: AC
  Administered 2019-04-01: 90 mg via INTRAVENOUS
  Filled 2019-04-01: qty 9

## 2019-04-01 MED ORDER — DEXAMETHASONE SODIUM PHOSPHATE 10 MG/ML IJ SOLN
INTRAMUSCULAR | Status: AC
  Filled 2019-04-01: qty 1

## 2019-04-01 MED ORDER — PALONOSETRON HCL INJECTION 0.25 MG/5ML
0.2500 mg | Freq: Once | INTRAVENOUS | Status: AC
  Administered 2019-04-01: 0.25 mg via INTRAVENOUS

## 2019-04-01 NOTE — Telephone Encounter (Signed)
Scheduled appt per sch msg. Called and left msg about changes to 6/25.

## 2019-04-01 NOTE — Progress Notes (Unsigned)
Per Dr. [name], okay to use CMP from 5/21 and okay to tx with elevated creatinine.

## 2019-04-01 NOTE — Progress Notes (Signed)
Hematology and Oncology Follow Up Visit  Terry Sullivan [id number] [birth year] 85 y.o. 04/01/2019 12:50 PM Terry Sullivan, MDGriffin, [name], MD   Principle Diagnosis: 85 year old man with high-grade invasive urothelial carcinoma of the bladder diagnosed in May 2020.  He presented with locally advanced disease without any evidence of distant metastasis as well as left pelvic perivesicular mass.  Clinical stage indicating T4N0.   Prior Therapy:  He is status post percutaneous biopsy of his bladder mass obtained on Mar 18, 2019 which confirmed the presence of urothelial carcinoma.  Current therapy: Definitive therapy with radiation as well as weekly carboplatin started on 04/01/2019.  Interim History: [name] presents today for a follow-up visit.  Since last visit, he underwent staging work-up including a PET scan as well as a biopsy which confirmed the presence of high-grade invasive urothelial carcinoma.  He received the first treatment of radiation today without any major complications.  He does report some constitutional symptoms including some fatigue and anorexia but no hematuria or abdominal pain.  His pain is overall manageable.  He is still ambulating without any major difficulties using a walker outside of his house and a cane inside.  He does not report any headaches, blurry vision, syncope or seizures. Does not report any fevers, chills or sweats.  Does not report any cough, wheezing or hemoptysis.  Does not report any chest pain, palpitation, orthopnea or leg edema.  Does not report any nausea, vomiting or abdominal pain.  Does not report any constipation or diarrhea.  Does not report any skeletal complaints.    Does not report frequency, urgency or hematuria.  Does not report any skin rashes or lesions. Does not report any heat or cold intolerance.  Does not report any lymphadenopathy or petechiae.  Does not report any anxiety or depression.  Remaining review of systems is negative.     Medications: I have reviewed the patient's current medications.  Current Outpatient Medications  Medication Sig Dispense Refill  . acetaminophen (TYLENOL) 500 MG tablet Take 500 mg by mouth 2 (two) times daily.    . cetirizine (ZYRTEC) 10 MG tablet Take by mouth as needed.    . doxazosin (CARDURA) 8 MG tablet Take 4 mg by mouth daily.    Marland Kitchen ELIQUIS 2.5 MG TABS tablet TAKE 1 TABLET BY MOUTH TWICE DAILY (Patient not taking: Reported on 03/16/2019) 60 tablet 6  . fluticasone (FLONASE) 50 MCG/ACT nasal spray INSTILL 2 SPRAYS INTO EACH NOSTRIL ONCE DAILY IF NEEDED    . gabapentin (NEURONTIN) 100 MG capsule Take 1 capsule by mouth 2 (two) times daily.    . OXYCONTIN 10 MG 12 hr tablet Take 1 tablet by mouth 2 (two) times daily.    . prochlorperazine (COMPAZINE) 10 MG tablet Take 1 tablet (10 mg total) by mouth every 6 (six) hours as needed for nausea or vomiting. 30 tablet 0  . tobramycin-dexamethasone (TOBRADEX) ophthalmic solution Instill one drop OD QID x 5 days     No current facility-administered medications for this visit.      Allergies: No Known Allergies  Past Medical History, Surgical history, Social history, and Family History were reviewed and updated.    Physical Exam: Blood pressure 110/62, pulse 67, temperature (!) 97.5 F (36.4 C), temperature source Oral, resp. rate 17, height 5\' 7"  (1.702 m), weight 156 lb 1.6 oz (70.8 kg), SpO2 97 %. ECOG: 1 General appearance: alert and cooperative appeared without distress. Head: Normocephalic, without obvious abnormality Oropharynx: No oral thrush or ulcers. Eyes: No  scleral icterus.  Pupils are equal and round reactive to light. Lymph nodes: Cervical, supraclavicular, and axillary nodes normal. Heart:regular rate and rhythm, S1, S2 normal, no murmur, click, rub or gallop Lung:chest clear, no wheezing, rales, normal symmetric air entry Abdomin: soft, non-tender, without masses or organomegaly. Neurological: No motor, sensory  deficits.  Intact deep tendon reflexes. Skin: No rashes or lesions.  No ecchymosis or petechiae. Musculoskeletal: No joint deformity or effusion. Psychiatric: Mood and affect are appropriate.    Lab Results: Lab Results  Component Value Date   WBC 7.3 03/18/2019   HGB 13.0 03/18/2019   HCT 38.5 (L) 03/18/2019   MCV 99.0 03/18/2019   PLT 264 03/18/2019     Chemistry      Component Value Date/Time   NA 138 03/18/2019 0801   K 4.2 03/18/2019 0801   CL 103 03/18/2019 0801   CO2 26 03/18/2019 0801   BUN 41 (H) 03/18/2019 0801   CREATININE 2.28 (H) 03/18/2019 0801      Component Value Date/Time   CALCIUM 9.0 03/18/2019 0801   ALKPHOS 83 03/18/2019 0801   AST 22 03/18/2019 0801   ALT 23 03/18/2019 0801   BILITOT 0.9 03/18/2019 0801     EXAM: NUCLEAR MEDICINE PET SKULL BASE TO THIGH  TECHNIQUE: 7.96 mCi F-18 FDG was injected intravenously. Full-ring PET imaging was performed from the skull base to thigh after the radiotracer. CT data was obtained and used for attenuation correction and anatomic localization.  Fasting blood glucose: 105 mg/dl  COMPARISON:  CT AP 03/02/2019  FINDINGS: Mediastinal blood pool activity: SUV max 2.75  Liver activity: SUV max NA  NECK: No hypermetabolic lymph nodes in the neck.  Incidental CT findings: none  CHEST: No hypermetabolic axillary, supraclavicular, mediastinal or hilar lymph nodes. Pulmonary nodule in the left lower lobe measures 4 mm, image 51/8. Too small to reliably characterize. This is unchanged however from 11/11/2011.  Incidental CT findings: Aortic atherosclerosis. Lad, and left circumflex coronary artery calcifications noted.  ABDOMEN/PELVIS: There is no focal abnormal areas of increased uptake within the liver, pancreas, spleen, or adrenal glands. Left pelvic sidewall lymph node measures 1 cm and has an SUV max of 4.33. The large invasive bladder mass with extension into the surround anterior, left  lateral, and left posterior soft tissues is again noted as seen on 03/02/2019. The abnormal soft tissue extending into the surrounding soft tissues is FDG avid compatible with metabolically active tumor. FDG uptake associated with the bladder mass is equal to 9.25. The tumor obstructs the left ureter at the level of the UVJ resulting in left-sided hydronephrosis.  Incidental CT findings: Aortic atherosclerosis. No aneurysm. No ascites within the abdomen or pelvis.  SKELETON: No focal hypermetabolic activity to suggest skeletal metastasis.  Incidental CT findings: none  IMPRESSION: 1. Large mass involving the urinary bladder is again noted and is FDG avid compatible with primary urothelial neoplasm. The tumor invades the surrounding ventral, left lateral and left posterior soft tissues. 2. Single borderline left pelvic sidewall lymph node measures 1 cm and exhibits mild increased uptake within SUV max of 4.33. No additional enlarged or FDG avid lymph nodes within the neck, chest, abdomen or pelvis. 3. Small pulmonary nodule in the left lower lobe is too small to reliably characterize but appears unchanged from 11/11/2011 favoring a benign process. 4.  Aortic Atherosclerosis (ICD10-I70.0).  Impression and Plan:  85 year old man with:   1.    High-grade urothelial carcinoma of the bladder diagnosed in May  2020.  He presented with pelvic mass extending into surrounding structures indicating T4 disease.    PET CT scan obtained on Mar 16, 2019 was reviewed and discussed with the patient did not show any evidence of systemic disease that is widespread.  Risks and benefits of treating his disease locally with radiation as well as weekly chemotherapy was reviewed today.  Complications associated with chemotherapy were reiterated.  These include nausea, fatigue, myelosuppression and renal insufficiency.  After discussion today, he is agreeable to proceed.  Plan is to complete 6 weeks of  carboplatin therapy.  We will obtain PDL 1 testing on his tumor and consider using immunotherapy if he develops disease progression in the future.  2.  Chronic anticoagulation with Eliquis: He is not having any hematuria issues at this time.  3.  Hydronephrosis: His kidney function will be monitored periodically.  His hydronephrosis may be need to be addressed via percutaneous nephrostomy tube.  4.  Pain: Related to chronic arthritis and manageable at this time.  His pain is not related to his cancer.  5.  Anorexia: I reiterated the importance of maintaining his weights and we talked about nutritional supplements.  Will consider Megace if he has any further decline.  6.  Goals of care and prognosis: His disease appears to be incurable and any therapy is palliative at this time.  His performance status is adequate is agreeable to continue at this time with aggressive measures.   7.  Follow-up: We will be weekly for carboplatin treatment and on a daily basis for radiation.  25  minutes was spent with the patient face-to-face today.  More than 50% of time was spent on reviewing imaging studies, discussing the natural course of his disease as well as updating family members via phone conversation.   [name], MD 6/4/202012:50 PM

## 2019-04-01 NOTE — Patient Instructions (Signed)
[facility] Discharge Instructions for Patients Receiving Chemotherapy  Today you received the following chemotherapy agents: Carboplatin.  To help prevent nausea and vomiting after your treatment, we encourage you to take your nausea medication as prescribed.   If you develop nausea and vomiting that is not controlled by your nausea medication, call the clinic.   BELOW ARE SYMPTOMS THAT SHOULD BE REPORTED IMMEDIATELY:  *FEVER GREATER THAN 100.5 F  *CHILLS WITH OR WITHOUT FEVER  NAUSEA AND VOMITING THAT IS NOT CONTROLLED WITH YOUR NAUSEA MEDICATION  *UNUSUAL SHORTNESS OF BREATH  *UNUSUAL BRUISING OR BLEEDING  TENDERNESS IN MOUTH AND THROAT WITH OR WITHOUT PRESENCE OF ULCERS  *URINARY PROBLEMS  *BOWEL PROBLEMS  UNUSUAL RASH Items with * indicate a potential emergency and should be followed up as soon as possible.  Feel free to call the clinic should you have any questions or concerns. The clinic phone number is [phone].  Please show the [facility] at check-in to the Emergency Department and triage nurse.  Carboplatin injection What is this medicine? CARBOPLATIN (KAR boe pla tin) is a chemotherapy drug. It targets fast dividing cells, like cancer cells, and causes these cells to die. This medicine is used to treat ovarian cancer and many other cancers. This medicine may be used for other purposes; ask your health care provider or pharmacist if you have questions. COMMON BRAND NAME(S): Paraplatin What should I tell my health care provider before I take this medicine? They need to know if you have any of these conditions: -blood disorders -hearing problems -kidney disease -recent or ongoing radiation therapy -an unusual or allergic reaction to carboplatin, cisplatin, other chemotherapy, other medicines, foods, dyes, or preservatives -pregnant or trying to get pregnant -breast-feeding How should I use this medicine? This drug is usually given as  an infusion into a vein. It is administered in a hospital or clinic by a specially trained health care professional. Talk to your pediatrician regarding the use of this medicine in children. Special care may be needed. Overdosage: If you think you have taken too much of this medicine contact a poison control center or emergency room at once. NOTE: This medicine is only for you. Do not share this medicine with others. What if I miss a dose? It is important not to miss a dose. Call your doctor or health care professional if you are unable to keep an appointment. What may interact with this medicine? -medicines for seizures -medicines to increase blood counts like filgrastim, pegfilgrastim, sargramostim -some antibiotics like amikacin, gentamicin, neomycin, streptomycin, tobramycin -vaccines Talk to your doctor or health care professional before taking any of these medicines: -acetaminophen -aspirin -ibuprofen -ketoprofen -naproxen This list may not describe all possible interactions. Give your health care provider a list of all the medicines, herbs, non-prescription drugs, or dietary supplements you use. Also tell them if you smoke, drink alcohol, or use illegal drugs. Some items may interact with your medicine. What should I watch for while using this medicine? Your condition will be monitored carefully while you are receiving this medicine. You will need important blood work done while you are taking this medicine. This drug may make you feel generally unwell. This is not uncommon, as chemotherapy can affect healthy cells as well as cancer cells. Report any side effects. Continue your course of treatment even though you feel ill unless your doctor tells you to stop. In some cases, you may be given additional medicines to help with side effects. Follow all  directions for their use. Call your doctor or health care professional for advice if you get a fever, chills or sore throat, or other  symptoms of a cold or flu. Do not treat yourself. This drug decreases your body's ability to fight infections. Try to avoid being around people who are sick. This medicine may increase your risk to bruise or bleed. Call your doctor or health care professional if you notice any unusual bleeding. Be careful brushing and flossing your teeth or using a toothpick because you may get an infection or bleed more easily. If you have any dental work done, tell your dentist you are receiving this medicine. Avoid taking products that contain aspirin, acetaminophen, ibuprofen, naproxen, or ketoprofen unless instructed by your doctor. These medicines may hide a fever. Do not become pregnant while taking this medicine. Women should inform their doctor if they wish to become pregnant or think they might be pregnant. There is a potential for serious side effects to an unborn child. Talk to your health care professional or pharmacist for more information. Do not breast-feed an infant while taking this medicine. What side effects may I notice from receiving this medicine? Side effects that you should report to your doctor or health care professional as soon as possible: -allergic reactions like skin rash, itching or hives, swelling of the face, lips, or tongue -signs of infection - fever or chills, cough, sore throat, pain or difficulty passing urine -signs of decreased platelets or bleeding - bruising, pinpoint red spots on the skin, black, tarry stools, nosebleeds -signs of decreased red blood cells - unusually weak or tired, fainting spells, lightheadedness -breathing problems -changes in hearing -changes in vision -chest pain -high blood pressure -low blood counts - This drug may decrease the number of white blood cells, red blood cells and platelets. You may be at increased risk for infections and bleeding. -nausea and vomiting -pain, swelling, redness or irritation at the injection site -pain, tingling,  numbness in the hands or feet -problems with balance, talking, walking -trouble passing urine or change in the amount of urine Side effects that usually do not require medical attention (report to your doctor or health care professional if they continue or are bothersome): -hair loss -loss of appetite -metallic taste in the mouth or changes in taste This list may not describe all possible side effects. Call your doctor for medical advice about side effects. You may report side effects to FDA at 1-800-FDA-1088. Where should I keep my medicine? This drug is given in a hospital or clinic and will not be stored at home. NOTE: This sheet is a summary. It may not cover all possible information. If you have questions about this medicine, talk to your doctor, pharmacist, or health care provider.  2019 Elsevier/Gold Standard (2008-01-19 14:38:05)

## 2019-04-02 ENCOUNTER — Ambulatory Visit
Admission: RE | Admit: 2019-04-02 | Discharge: 2019-04-02 | Disposition: A | Discharge status: Home / Self Care | Payer: Medicare Other | Source: Ambulatory Visit | Attending: Radiation Oncology | Admitting: Radiation Oncology

## 2019-04-02 ENCOUNTER — Other Ambulatory Visit: Payer: Self-pay

## 2019-04-02 DIAGNOSIS — Z510 Encounter for antineoplastic radiation therapy: Secondary | ICD-10-CM | POA: Diagnosis not present

## 2019-04-05 ENCOUNTER — Other Ambulatory Visit: Payer: Self-pay

## 2019-04-05 ENCOUNTER — Ambulatory Visit
Admission: RE | Admit: 2019-04-05 | Discharge: 2019-04-05 | Disposition: A | Discharge status: Home / Self Care | Payer: Medicare Other | Source: Ambulatory Visit | Attending: Radiation Oncology | Admitting: Radiation Oncology

## 2019-04-05 DIAGNOSIS — Z510 Encounter for antineoplastic radiation therapy: Secondary | ICD-10-CM | POA: Diagnosis not present

## 2019-04-06 ENCOUNTER — Other Ambulatory Visit: Payer: Self-pay

## 2019-04-06 ENCOUNTER — Ambulatory Visit
Admission: RE | Admit: 2019-04-06 | Discharge: 2019-04-06 | Disposition: A | Discharge status: Home / Self Care | Payer: Medicare Other | Source: Ambulatory Visit | Attending: Radiation Oncology | Admitting: Radiation Oncology

## 2019-04-06 DIAGNOSIS — Z510 Encounter for antineoplastic radiation therapy: Secondary | ICD-10-CM | POA: Diagnosis not present

## 2019-04-07 ENCOUNTER — Other Ambulatory Visit: Payer: Self-pay

## 2019-04-07 ENCOUNTER — Ambulatory Visit
Admission: RE | Admit: 2019-04-07 | Discharge: 2019-04-07 | Disposition: A | Discharge status: Home / Self Care | Payer: Medicare Other | Source: Ambulatory Visit | Attending: Radiation Oncology | Admitting: Radiation Oncology

## 2019-04-07 DIAGNOSIS — Z510 Encounter for antineoplastic radiation therapy: Secondary | ICD-10-CM | POA: Diagnosis not present

## 2019-04-07 NOTE — Telephone Encounter (Signed)
Please respond to patient

## 2019-04-08 ENCOUNTER — Inpatient Hospital Stay: Payer: Medicare Other

## 2019-04-08 ENCOUNTER — Other Ambulatory Visit: Payer: Self-pay

## 2019-04-08 ENCOUNTER — Ambulatory Visit
Admission: RE | Admit: 2019-04-08 | Discharge: 2019-04-08 | Disposition: A | Discharge status: Home / Self Care | Payer: Medicare Other | Source: Ambulatory Visit | Attending: Radiation Oncology | Admitting: Radiation Oncology

## 2019-04-08 VITALS — BP 110/79 | HR 67 | Temp 98.3°F | Resp 18

## 2019-04-08 DIAGNOSIS — C672 Malignant neoplasm of lateral wall of bladder: Secondary | ICD-10-CM

## 2019-04-08 DIAGNOSIS — C678 Malignant neoplasm of overlapping sites of bladder: Secondary | ICD-10-CM

## 2019-04-08 DIAGNOSIS — Z510 Encounter for antineoplastic radiation therapy: Secondary | ICD-10-CM | POA: Diagnosis not present

## 2019-04-08 LAB — CBC WITH DIFFERENTIAL (CANCER CENTER ONLY)
Abs Immature Granulocytes: 0.03 10*3/uL (ref 0.00–0.07)
Basophils Absolute: 0 10*3/uL (ref 0.0–0.1)
Basophils Relative: 1 %
Eosinophils Absolute: 0.2 10*3/uL (ref 0.0–0.5)
Eosinophils Relative: 4 %
HCT: 34.1 % — ABNORMAL LOW (ref 39.0–52.0)
Hemoglobin: 11.2 g/dL — ABNORMAL LOW (ref 13.0–17.0)
Immature Granulocytes: 1 %
Lymphocytes Relative: 19 %
Lymphs Abs: 1 10*3/uL (ref 0.7–4.0)
MCH: 32.7 pg (ref 26.0–34.0)
MCHC: 32.8 g/dL (ref 30.0–36.0)
MCV: 99.4 fL (ref 80.0–100.0)
Monocytes Absolute: 0.4 10*3/uL (ref 0.1–1.0)
Monocytes Relative: 8 %
Neutro Abs: 3.7 10*3/uL (ref 1.7–7.7)
Neutrophils Relative %: 67 %
Platelet Count: 145 10*3/uL — ABNORMAL LOW (ref 150–400)
RBC: 3.43 MIL/uL — ABNORMAL LOW (ref 4.22–5.81)
RDW: 12.8 % (ref 11.5–15.5)
WBC Count: 5.4 10*3/uL (ref 4.0–10.5)
nRBC: 0 % (ref 0.0–0.2)

## 2019-04-08 LAB — CMP (CANCER CENTER ONLY)
ALT: 16 U/L (ref 0–44)
AST: 15 U/L (ref 15–41)
Albumin: 3.9 g/dL (ref 3.5–5.0)
Alkaline Phosphatase: 80 U/L (ref 38–126)
Anion gap: 10 (ref 5–15)
BUN: 46 mg/dL — ABNORMAL HIGH (ref 8–23)
CO2: 24 mmol/L (ref 22–32)
Calcium: 8.8 mg/dL — ABNORMAL LOW (ref 8.9–10.3)
Chloride: 104 mmol/L (ref 98–111)
Creatinine: 1.92 mg/dL — ABNORMAL HIGH (ref 0.61–1.24)
GFR, Est AFR Am: 34 mL/min — ABNORMAL LOW (ref >60)
GFR, Estimated: 30 mL/min — ABNORMAL LOW (ref >60)
Glucose, Bld: 104 mg/dL — ABNORMAL HIGH (ref 70–99)
Potassium: 5 mmol/L (ref 3.5–5.1)
Sodium: 138 mmol/L (ref 135–145)
Total Bilirubin: 0.4 mg/dL (ref 0.3–1.2)
Total Protein: 6.9 g/dL (ref 6.5–8.1)

## 2019-04-08 MED ORDER — PALONOSETRON HCL INJECTION 0.25 MG/5ML
INTRAVENOUS | Status: AC
  Filled 2019-04-08: qty 5

## 2019-04-08 MED ORDER — DEXAMETHASONE SODIUM PHOSPHATE 10 MG/ML IJ SOLN
INTRAMUSCULAR | Status: AC
  Filled 2019-04-08: qty 1

## 2019-04-08 MED ORDER — SODIUM CHLORIDE 0.9 % IV SOLN
Freq: Once | INTRAVENOUS | Status: AC
  Administered 2019-04-08: 14:00:00 via INTRAVENOUS
  Filled 2019-04-08: qty 250

## 2019-04-08 MED ORDER — PALONOSETRON HCL INJECTION 0.25 MG/5ML
0.2500 mg | Freq: Once | INTRAVENOUS | Status: AC
  Administered 2019-04-08: 14:00:00 0.25 mg via INTRAVENOUS

## 2019-04-08 MED ORDER — SODIUM CHLORIDE 0.9 % IV SOLN
92.2000 mg | Freq: Once | INTRAVENOUS | Status: AC
  Administered 2019-04-08: 14:00:00 90 mg via INTRAVENOUS
  Filled 2019-04-08: qty 9

## 2019-04-08 MED ORDER — DEXAMETHASONE SODIUM PHOSPHATE 10 MG/ML IJ SOLN
10.0000 mg | Freq: Once | INTRAMUSCULAR | Status: AC
  Administered 2019-04-08: 14:00:00 10 mg via INTRAVENOUS

## 2019-04-08 NOTE — Progress Notes (Signed)
Per Dr. [name] it is ok to treat today with Creatine 1.92

## 2019-04-08 NOTE — Patient Instructions (Signed)
[facility] Discharge Instructions for Patients Receiving Chemotherapy  Today you received the following chemotherapy agents Carboplatin (PARAPLATIN).  To help prevent nausea and vomiting after your treatment, we encourage you to take your nausea medication as prescribed.   If you develop nausea and vomiting that is not controlled by your nausea medication, call the clinic.   BELOW ARE SYMPTOMS THAT SHOULD BE REPORTED IMMEDIATELY:  *FEVER GREATER THAN 100.5 F  *CHILLS WITH OR WITHOUT FEVER  NAUSEA AND VOMITING THAT IS NOT CONTROLLED WITH YOUR NAUSEA MEDICATION  *UNUSUAL SHORTNESS OF BREATH  *UNUSUAL BRUISING OR BLEEDING  TENDERNESS IN MOUTH AND THROAT WITH OR WITHOUT PRESENCE OF ULCERS  *URINARY PROBLEMS  *BOWEL PROBLEMS  UNUSUAL RASH Items with * indicate a potential emergency and should be followed up as soon as possible.  Feel free to call the clinic should you have any questions or concerns. The clinic phone number is [phone].  Please show the CHEMO ALERT CARD at check-in to the Emergency Department and triage nurse.  Coronavirus (COVID-19) Are you at risk?  Are you at risk for the Coronavirus (COVID-19)?  To be considered HIGH RISK for Coronavirus (COVID-19), you have to meet the following criteria:  . Traveled to [place], [place], [place], [place] or [place]; or in the [place] to [place], [place], [place], or [place]; and have fever, cough, and shortness of breath within the last 2 weeks of travel OR . Been in close contact with a person diagnosed with COVID-19 within the last 2 weeks and have fever, cough, and shortness of breath . IF YOU DO NOT MEET THESE CRITERIA, YOU ARE CONSIDERED LOW RISK FOR COVID-19.  What to do if you are HIGH RISK for COVID-19?  . If you are having a medical emergency, call 911. . Seek medical care right away. Before you go to a doctor's office, urgent care or emergency department, call ahead and tell  them about your recent travel, contact with someone diagnosed with COVID-19, and your symptoms. You should receive instructions from your physician's office regarding next steps of care.  . When you arrive at healthcare provider, tell the healthcare staff immediately you have returned from visiting [place], [place], [place], [place] or [place]; or traveled in the [place] to [place], [place], [place], or [place]; in the last two weeks or you have been in close contact with a person diagnosed with COVID-19 in the last 2 weeks.   . Tell the health care staff about your symptoms: fever, cough and shortness of breath. . After you have been seen by a medical provider, you will be either: o Tested for (COVID-19) and discharged home on quarantine except to seek medical care if symptoms worsen, and asked to  - Stay home and avoid contact with others until you get your results (4-5 days)  - Avoid travel on public transportation if possible (such as bus, train, or airplane) or o Sent to the Emergency Department by EMS for evaluation, COVID-19 testing, and possible admission depending on your condition and test results.  What to do if you are LOW RISK for COVID-19?  Reduce your risk of any infection by using the same precautions used for avoiding the common cold or flu:  . Wash your hands often with soap and warm water for at least 20 seconds.  If soap and water are not readily available, use an alcohol-based hand sanitizer with at least 60% alcohol.  . If coughing or   sneezing, cover your mouth and nose by coughing or sneezing into the elbow areas of your shirt or coat, into a tissue or into your sleeve (not your hands). . Avoid shaking hands with others and consider head nods or verbal greetings only. . Avoid touching your eyes, nose, or mouth with unwashed hands.  . Avoid close contact with people who are sick. . Avoid places or events with large numbers of people in one location, like concerts or  sporting events. . Carefully consider travel plans you have or are making. . If you are planning any travel outside or inside the [place], visit the CDC's Travelers' Health webpage for the latest health notices. . If you have some symptoms but not all symptoms, continue to monitor at home and seek medical attention if your symptoms worsen. . If you are having a medical emergency, call 911.   ADDITIONAL HEALTHCARE OPTIONS FOR PATIENTS  [facility] / e-Visit: [email or web address]         [facility]: [phone]  [facility]: [phone]                   [facility]: [phone]   

## 2019-04-09 ENCOUNTER — Ambulatory Visit
Admission: RE | Admit: 2019-04-09 | Discharge: 2019-04-09 | Disposition: A | Discharge status: Home / Self Care | Payer: Medicare Other | Source: Ambulatory Visit | Attending: Radiation Oncology | Admitting: Radiation Oncology

## 2019-04-09 ENCOUNTER — Other Ambulatory Visit: Payer: Self-pay

## 2019-04-09 DIAGNOSIS — Z510 Encounter for antineoplastic radiation therapy: Secondary | ICD-10-CM | POA: Diagnosis not present

## 2019-04-10 DIAGNOSIS — R001 Bradycardia, unspecified: Secondary | ICD-10-CM | POA: Insufficient documentation

## 2019-04-10 DIAGNOSIS — I480 Paroxysmal atrial fibrillation: Secondary | ICD-10-CM | POA: Insufficient documentation

## 2019-04-10 DIAGNOSIS — I340 Nonrheumatic mitral (valve) insufficiency: Secondary | ICD-10-CM | POA: Insufficient documentation

## 2019-04-10 NOTE — Progress Notes (Signed)
Subjective:  Primary Physician:  [name], MD  Patient ID: Terry Sullivan, male    DOB: Jul 25, 1924, 85 y.o.   MRN: [id number]  This visit type was conducted due to national recommendations for restrictions regarding the COVID-19 Pandemic (e.g. social distancing).  This format is felt to be most appropriate for this patient at this time.  All issues noted in this document were discussed and addressed.  No physical exam was performed (except for noted visual exam findings with Telehealth visits - very limited).  The patient has consented to conduct a Telehealth visit and understands insurance will be billed.   I connected with patient, on 04/12/19  by a video enabled telemedicine application and verified that I am speaking with the correct person using two identifiers.     I discussed the limitations of evaluation and management by telemedicine and the availability of in person appointments. The patient expressed understanding and agreed to proceed.   I have discussed with patient regarding the safety during COVID Pandemic and steps and precautions including social distancing with the patient.   Chief Complaint  Patient presents with  . Atrial Fibrillation  . Mitral Regurgitation  . Follow-up    68mth    HPI: Terry Sullivan  is a 85 y.o. male. Patient had atrial flutter in the past which converted to atrial fibrillation and then to low atrial rhythm in Jan. 2019 with slow heart rates. Metoprolol was discontinued, now the heart rates are in 50s at home. He denies feeling any dizziness, near-syncope or syncope. He denies feellng any palpitation, rapid or irregular heartbeat  No complaints of chest pain, tightness or pressure. No shortness of breath, orthopnea or PND. No history of swelling on the legs and no claudication. Patient is complaining of feeling weak, tired.  Patient has been diagnosed to have carcinoma of the bladder recently.  He is getting radiation therapy and  chemotherapy.  Patient has hematuria off and on and occasionally has blood clots in the urine. No h/o GI bleed.  No history of hypertension or diabetes. He has history of high cholesterol. He does not smoke. He has chronic low backache for many years. He has difficulty in walking due to pain. He used to do stretching exercises at [facility] every day, but, has not been doing it for past 3 months because of COVID virus situation. He also has history of BPH. He also has renal insufficiency, serum creatinine was 1.92 on 04/08/2019.  No history of thyroid problems. No history of TIA or CVA. No history of MI.   Past Medical History:  Diagnosis Date  . Bladder cancer [facility])     Past Surgical History:  Procedure Laterality Date  . BACK SURGERY      Social History   Socioeconomic History  . Marital status: Widowed    Spouse name: Not on file  . Number of children: Not on file  . Years of education: Not on file  . Highest education level: Not on file  Occupational History  . Not on file  Social Needs  . Financial resource strain: Not on file  . Food insecurity    Worry: Not on file    Inability: Not on file  . Transportation needs    Medical: Not on file    Non-medical: Not on file  Tobacco Use  . Smoking status: Never Smoker  . Smokeless tobacco: Never Used  Substance and Sexual Activity  . Alcohol use: Not Currently  . Drug use:  Never  . Sexual activity: Not Currently  Lifestyle  . Physical activity    Days per week: Not on file    Minutes per session: Not on file  . Stress: Not on file  Relationships  . Social Herbalist on phone: Not on file    Gets together: Not on file    Attends religious service: Not on file    Active member of club or organization: Not on file    Attends meetings of clubs or organizations: Not on file    Relationship status: Not on file  . Intimate partner violence    Fear of current or ex partner: Not on file    Emotionally abused: Not  on file    Physically abused: Not on file    Forced sexual activity: Not on file  Other Topics Concern  . Not on file  Social History Narrative  . Not on file    Current Outpatient Medications on File Prior to Visit  Medication Sig Dispense Refill  . acetaminophen (TYLENOL) 500 MG tablet Take 500 mg by mouth 2 (two) times daily.    . cetirizine (ZYRTEC) 10 MG tablet Take by mouth as needed.    . doxazosin (CARDURA) 8 MG tablet Take 4 mg by mouth daily.    . fluticasone (FLONASE) 50 MCG/ACT nasal spray INSTILL 2 SPRAYS INTO EACH NOSTRIL ONCE DAILY IF NEEDED    . gabapentin (NEURONTIN) 100 MG capsule Take 1 capsule by mouth 2 (two) times daily.    . OXYCONTIN 10 MG 12 hr tablet Take 1 tablet by mouth 2 (two) times daily.    . prochlorperazine (COMPAZINE) 10 MG tablet Take 1 tablet (10 mg total) by mouth every 6 (six) hours as needed for nausea or vomiting. 30 tablet 0  . tobramycin-dexamethasone (TOBRADEX) ophthalmic solution Instill one drop OD QID x 5 days     No current facility-administered medications on file prior to visit.     Review of Systems  Constitutional: Negative for fever.  HENT: Negative for nosebleeds.   Eyes: Negative for blurred vision.  Respiratory: Negative for cough and shortness of breath.   Cardiovascular: Negative for chest pain, palpitations and leg swelling.  Gastrointestinal: Negative for abdominal pain, nausea and vomiting.  Genitourinary: Positive for hematuria. Negative for dysuria.  Musculoskeletal: Negative for myalgias.  Skin: Negative for itching and rash.  Neurological: Negative for dizziness, seizures and loss of consciousness.  Psychiatric/Behavioral: The patient is not nervous/anxious.       Objective:  Blood pressure 139/90, pulse (!) 59, height 5\' 7"  (1.702 m), weight 158 lb 12.8 oz (72 kg). Body mass index is 24.87 kg/m.  Physical Exam: Patient is alert and oriented, well-built, nourished, appeared comfortable talking to me during the  visit. No further detailed physical examination was possible as it was a telemedicine visit.  CARDIAC STUDIES:  Echo- 09/30/2018 1. Left ventricle cavity is normal in size. Mild concentric hypertrophy of the left ventricle. Normal global wall motion. Visual EF is 60-65%. 2. No aortic valve regurgitation noted. Mild calcification of the aortic valve annulus. Mild aortic valve leaflet calcification. Mildly restricted aortic valve leaflets. No evidence of aortic valve stenosis. 3. Mild (Grade I) mitral regurgitation. Mild calcification of the mitral valve annulus. 4. Mild tricuspid regurgitation. 5. IVC is dilated with respiratory variation, suggests elevated RA pressure. 6. c.f. echo. of 06/26/2016, PA pressure is normal, no other diagnostic change.  Assessment & Recommendations:   1. Paroxysmal atrial fibrillation (  [place])  2. Bradycardia  3. Mild mitral regurgitation   Laboratory Exam:  CBC Latest Ref Rng & Units 04/08/2019 04/01/2019 03/18/2019  WBC 4.0 - 10.5 K/uL 5.4 6.7 7.3  Hemoglobin 13.0 - 17.0 g/dL 11.2(L) 12.0(L) 13.0  Hematocrit 39.0 - 52.0 % 34.1(L) 36.2(L) 38.5(L)  Platelets 150 - 400 K/uL 145(L) 178 264   CMP Latest Ref Rng & Units 04/08/2019 04/01/2019 03/18/2019  Glucose 70 - 99 mg/dL 104(H) 94 106(H)  BUN 8 - 23 mg/dL 46(H) 42(H) 41(H)  Creatinine 0.61 - 1.24 mg/dL 1.92(H) 2.16(H) 2.28(H)  Sodium 135 - 145 mmol/L 138 138 138  Potassium 3.5 - 5.1 mmol/L 5.0 4.8 4.2  Chloride 98 - 111 mmol/L 104 105 103  CO2 22 - 32 mmol/L 24 21(L) 26  Calcium 8.9 - 10.3 mg/dL 8.8(L) 9.1 9.0  Total Protein 6.5 - 8.1 g/dL 6.9 7.2 7.8  Total Bilirubin 0.3 - 1.2 mg/dL 0.4 0.5 0.9  Alkaline Phos 38 - 126 U/L 80 78 83  AST 15 - 41 U/L 15 18 22   ALT 0 - 44 U/L 16 13 23    Lipid Panel  No results found for: CHOL, TRIG, HDL, CHOLHDL, VLDL, LDLCALC, LDLDIRECT   Recommendation:  Arrhythmia is stable with heart rates in 50s at home, asymptomatic. Patient denies any dizziness, near-syncope  or syncope. He was advised to continue monitoring heart rate at home.Patient was advised to call us if he has severe dizziness, near-syncope or syncope.  In view of hematuria, I have advised patient to stop Eliquis. Instead, he will take enteric-coated aspirin 81 mg daily.  He was also advised that if the hematuria becomes worse, he should stop aspirin.  Primary prevention was discussed. He was advised to follow low cholesterol diet.   I will see him in follow-up after 6 months, but call us earlier if there are any cardiac problems.  [name], MD, [facility] 04/12/2019, 10:52 AM [facility] [place] Pager: [phone] Office: [phone] If no answer Cell [phone]

## 2019-04-12 ENCOUNTER — Ambulatory Visit (INDEPENDENT_AMBULATORY_CARE_PROVIDER_SITE_OTHER): Payer: Medicare Other | Admitting: Cardiology

## 2019-04-12 ENCOUNTER — Ambulatory Visit
Admission: RE | Admit: 2019-04-12 | Discharge: 2019-04-12 | Disposition: A | Discharge status: Home / Self Care | Payer: Medicare Other | Source: Ambulatory Visit | Attending: Radiation Oncology | Admitting: Radiation Oncology

## 2019-04-12 ENCOUNTER — Encounter: Payer: Self-pay | Admitting: Cardiology

## 2019-04-12 ENCOUNTER — Other Ambulatory Visit: Payer: Self-pay

## 2019-04-12 DIAGNOSIS — R001 Bradycardia, unspecified: Secondary | ICD-10-CM

## 2019-04-12 DIAGNOSIS — I480 Paroxysmal atrial fibrillation: Secondary | ICD-10-CM | POA: Diagnosis not present

## 2019-04-12 DIAGNOSIS — I340 Nonrheumatic mitral (valve) insufficiency: Secondary | ICD-10-CM

## 2019-04-12 DIAGNOSIS — Z510 Encounter for antineoplastic radiation therapy: Secondary | ICD-10-CM | POA: Diagnosis not present

## 2019-04-12 MED ORDER — ASPIRIN EC 81 MG PO TBEC: 81.0000 mg | DELAYED_RELEASE_TABLET | Freq: Every day | ORAL | 3 refills | Status: DC

## 2019-04-13 ENCOUNTER — Encounter: Payer: Self-pay | Admitting: Oncology

## 2019-04-13 ENCOUNTER — Ambulatory Visit
Admission: RE | Admit: 2019-04-13 | Discharge: 2019-04-13 | Disposition: A | Discharge status: Home / Self Care | Payer: Medicare Other | Source: Ambulatory Visit | Attending: Radiation Oncology | Admitting: Radiation Oncology

## 2019-04-13 ENCOUNTER — Other Ambulatory Visit: Payer: Self-pay

## 2019-04-13 DIAGNOSIS — Z510 Encounter for antineoplastic radiation therapy: Secondary | ICD-10-CM | POA: Diagnosis not present

## 2019-04-14 ENCOUNTER — Ambulatory Visit
Admission: RE | Admit: 2019-04-14 | Discharge: 2019-04-14 | Disposition: A | Discharge status: Home / Self Care | Payer: Medicare Other | Source: Ambulatory Visit | Attending: Radiation Oncology | Admitting: Radiation Oncology

## 2019-04-14 ENCOUNTER — Other Ambulatory Visit: Payer: Self-pay

## 2019-04-14 DIAGNOSIS — Z510 Encounter for antineoplastic radiation therapy: Secondary | ICD-10-CM | POA: Diagnosis not present

## 2019-04-14 NOTE — Telephone Encounter (Signed)
From patent

## 2019-04-15 ENCOUNTER — Ambulatory Visit
Admission: RE | Admit: 2019-04-15 | Discharge: 2019-04-15 | Disposition: A | Discharge status: Home / Self Care | Payer: Medicare Other | Source: Ambulatory Visit | Attending: Radiation Oncology | Admitting: Radiation Oncology

## 2019-04-15 ENCOUNTER — Other Ambulatory Visit: Payer: Self-pay

## 2019-04-15 ENCOUNTER — Inpatient Hospital Stay: Payer: Medicare Other

## 2019-04-15 VITALS — BP 122/75 | HR 77 | Temp 97.6°F | Resp 17

## 2019-04-15 DIAGNOSIS — C678 Malignant neoplasm of overlapping sites of bladder: Secondary | ICD-10-CM | POA: Diagnosis not present

## 2019-04-15 DIAGNOSIS — Z510 Encounter for antineoplastic radiation therapy: Secondary | ICD-10-CM | POA: Diagnosis not present

## 2019-04-15 DIAGNOSIS — C672 Malignant neoplasm of lateral wall of bladder: Secondary | ICD-10-CM

## 2019-04-15 LAB — CBC WITH DIFFERENTIAL (CANCER CENTER ONLY)
Abs Immature Granulocytes: 0.01 10*3/uL (ref 0.00–0.07)
Basophils Absolute: 0 10*3/uL (ref 0.0–0.1)
Basophils Relative: 1 %
Eosinophils Absolute: 0.2 10*3/uL (ref 0.0–0.5)
Eosinophils Relative: 3 %
HCT: 33.9 % — ABNORMAL LOW (ref 39.0–52.0)
Hemoglobin: 10.9 g/dL — ABNORMAL LOW (ref 13.0–17.0)
Immature Granulocytes: 0 %
Lymphocytes Relative: 13 %
Lymphs Abs: 0.6 10*3/uL — ABNORMAL LOW (ref 0.7–4.0)
MCH: 32.5 pg (ref 26.0–34.0)
MCHC: 32.2 g/dL (ref 30.0–36.0)
MCV: 101.2 fL — ABNORMAL HIGH (ref 80.0–100.0)
Monocytes Absolute: 0.3 10*3/uL (ref 0.1–1.0)
Monocytes Relative: 7 %
Neutro Abs: 3.7 10*3/uL (ref 1.7–7.7)
Neutrophils Relative %: 76 %
Platelet Count: 93 10*3/uL — ABNORMAL LOW (ref 150–400)
RBC: 3.35 MIL/uL — ABNORMAL LOW (ref 4.22–5.81)
RDW: 13.4 % (ref 11.5–15.5)
WBC Count: 4.8 10*3/uL (ref 4.0–10.5)
nRBC: 0 % (ref 0.0–0.2)

## 2019-04-15 LAB — CMP (CANCER CENTER ONLY)
ALT: 17 U/L (ref 0–44)
AST: 16 U/L (ref 15–41)
Albumin: 3.8 g/dL (ref 3.5–5.0)
Alkaline Phosphatase: 81 U/L (ref 38–126)
Anion gap: 12 (ref 5–15)
BUN: 42 mg/dL — ABNORMAL HIGH (ref 8–23)
CO2: 23 mmol/L (ref 22–32)
Calcium: 8.5 mg/dL — ABNORMAL LOW (ref 8.9–10.3)
Chloride: 108 mmol/L (ref 98–111)
Creatinine: 1.75 mg/dL — ABNORMAL HIGH (ref 0.61–1.24)
GFR, Est AFR Am: 38 mL/min — ABNORMAL LOW (ref >60)
GFR, Estimated: 33 mL/min — ABNORMAL LOW (ref >60)
Glucose, Bld: 112 mg/dL — ABNORMAL HIGH (ref 70–99)
Potassium: 4.5 mmol/L (ref 3.5–5.1)
Sodium: 143 mmol/L (ref 135–145)
Total Bilirubin: 0.4 mg/dL (ref 0.3–1.2)
Total Protein: 6.8 g/dL (ref 6.5–8.1)

## 2019-04-15 MED ORDER — DEXAMETHASONE SODIUM PHOSPHATE 10 MG/ML IJ SOLN
INTRAMUSCULAR | Status: AC
  Filled 2019-04-15: qty 1

## 2019-04-15 MED ORDER — SODIUM CHLORIDE 0.9 % IV SOLN
92.2000 mg | Freq: Once | INTRAVENOUS | Status: AC
  Administered 2019-04-15: 90 mg via INTRAVENOUS
  Filled 2019-04-15: qty 9

## 2019-04-15 MED ORDER — SODIUM CHLORIDE 0.9 % IV SOLN
Freq: Once | INTRAVENOUS | Status: AC
  Administered 2019-04-15: 15:00:00 via INTRAVENOUS
  Filled 2019-04-15: qty 250

## 2019-04-15 MED ORDER — PALONOSETRON HCL INJECTION 0.25 MG/5ML
INTRAVENOUS | Status: AC
  Filled 2019-04-15: qty 5

## 2019-04-15 MED ORDER — PALONOSETRON HCL INJECTION 0.25 MG/5ML
0.2500 mg | Freq: Once | INTRAVENOUS | Status: AC
  Administered 2019-04-15: 15:00:00 0.25 mg via INTRAVENOUS

## 2019-04-15 MED ORDER — DEXAMETHASONE SODIUM PHOSPHATE 10 MG/ML IJ SOLN
10.0000 mg | Freq: Once | INTRAMUSCULAR | Status: AC
  Administered 2019-04-15: 10 mg via INTRAVENOUS

## 2019-04-15 NOTE — Telephone Encounter (Signed)
From patient.

## 2019-04-15 NOTE — Patient Instructions (Signed)
[facility] Discharge Instructions for Patients Receiving Chemotherapy  Today you received the following chemotherapy agents Carboplatin  To help prevent nausea and vomiting after your treatment, we encourage you to take your nausea medication as directed   If you develop nausea and vomiting that is not controlled by your nausea medication, call the clinic.   BELOW ARE SYMPTOMS THAT SHOULD BE REPORTED IMMEDIATELY:  *FEVER GREATER THAN 100.5 F  *CHILLS WITH OR WITHOUT FEVER  NAUSEA AND VOMITING THAT IS NOT CONTROLLED WITH YOUR NAUSEA MEDICATION  *UNUSUAL SHORTNESS OF BREATH  *UNUSUAL BRUISING OR BLEEDING  TENDERNESS IN MOUTH AND THROAT WITH OR WITHOUT PRESENCE OF ULCERS  *URINARY PROBLEMS  *BOWEL PROBLEMS  UNUSUAL RASH Items with * indicate a potential emergency and should be followed up as soon as possible.  Feel free to call the clinic should you have any questions or concerns. The clinic phone number is [phone].  Please show the CHEMO ALERT CARD at check-in to the Emergency Department and triage nurse.   

## 2019-04-15 NOTE — Progress Notes (Signed)
Okay to treat with plt 93 and creat 1.75 per Dr. [name].

## 2019-04-16 ENCOUNTER — Telehealth: Payer: Self-pay

## 2019-04-16 ENCOUNTER — Other Ambulatory Visit: Payer: Self-pay

## 2019-04-16 ENCOUNTER — Ambulatory Visit
Admission: RE | Admit: 2019-04-16 | Discharge: 2019-04-16 | Disposition: A | Discharge status: Home / Self Care | Payer: Medicare Other | Source: Ambulatory Visit | Attending: Radiation Oncology | Admitting: Radiation Oncology

## 2019-04-16 DIAGNOSIS — Z510 Encounter for antineoplastic radiation therapy: Secondary | ICD-10-CM | POA: Diagnosis not present

## 2019-04-16 NOTE — Telephone Encounter (Signed)
Received after hours on call message that patient called last night due to multiple episodes of explosive diarrhea. He stated that he took Imodium as the nurse instructed and feels better this morning. He stated that he is eating breakfast right now and understands the importance of good food and fluid intake as diarrhea can lead to constipation. He asked if he should take Imodium this morning and he has not had any further diarrhea. Explained to take the imodium to control the diarrhea but to not take too much as this can cause constipation. He understands to call back if he has diarrhea uncontrolled with Imodium and if this occurs after hours to then go to the ED as dehydration can set in quickly. Patient verbalized understanding and had no other questions or concerns.

## 2019-04-19 ENCOUNTER — Other Ambulatory Visit: Payer: Self-pay

## 2019-04-19 ENCOUNTER — Ambulatory Visit
Admission: RE | Admit: 2019-04-19 | Discharge: 2019-04-19 | Disposition: A | Discharge status: Home / Self Care | Payer: Medicare Other | Source: Ambulatory Visit | Attending: Radiation Oncology | Admitting: Radiation Oncology

## 2019-04-19 DIAGNOSIS — Z510 Encounter for antineoplastic radiation therapy: Secondary | ICD-10-CM | POA: Diagnosis not present

## 2019-04-20 ENCOUNTER — Other Ambulatory Visit: Payer: Self-pay

## 2019-04-20 ENCOUNTER — Ambulatory Visit
Admission: RE | Admit: 2019-04-20 | Discharge: 2019-04-20 | Disposition: A | Discharge status: Home / Self Care | Payer: Medicare Other | Source: Ambulatory Visit | Attending: Radiation Oncology | Admitting: Radiation Oncology

## 2019-04-20 DIAGNOSIS — Z510 Encounter for antineoplastic radiation therapy: Secondary | ICD-10-CM | POA: Diagnosis not present

## 2019-04-21 ENCOUNTER — Ambulatory Visit
Admission: RE | Admit: 2019-04-21 | Discharge: 2019-04-21 | Disposition: A | Discharge status: Home / Self Care | Payer: Medicare Other | Source: Ambulatory Visit | Attending: Radiation Oncology | Admitting: Radiation Oncology

## 2019-04-21 DIAGNOSIS — Z510 Encounter for antineoplastic radiation therapy: Secondary | ICD-10-CM | POA: Diagnosis not present

## 2019-04-22 ENCOUNTER — Telehealth: Payer: Self-pay | Admitting: Oncology

## 2019-04-22 ENCOUNTER — Inpatient Hospital Stay: Payer: Medicare Other

## 2019-04-22 ENCOUNTER — Other Ambulatory Visit: Payer: Medicare Other

## 2019-04-22 ENCOUNTER — Inpatient Hospital Stay (HOSPITAL_BASED_OUTPATIENT_CLINIC_OR_DEPARTMENT_OTHER): Payer: Medicare Other | Admitting: Oncology

## 2019-04-22 ENCOUNTER — Other Ambulatory Visit: Payer: Self-pay

## 2019-04-22 ENCOUNTER — Ambulatory Visit
Admission: RE | Admit: 2019-04-22 | Discharge: 2019-04-22 | Disposition: A | Discharge status: Home / Self Care | Payer: Medicare Other | Source: Ambulatory Visit | Attending: Radiation Oncology | Admitting: Radiation Oncology

## 2019-04-22 VITALS — BP 136/80 | HR 81 | Temp 98.3°F | Resp 18 | Ht 67.0 in | Wt 154.4 lb

## 2019-04-22 DIAGNOSIS — R911 Solitary pulmonary nodule: Secondary | ICD-10-CM

## 2019-04-22 DIAGNOSIS — C672 Malignant neoplasm of lateral wall of bladder: Secondary | ICD-10-CM

## 2019-04-22 DIAGNOSIS — Z7982 Long term (current) use of aspirin: Secondary | ICD-10-CM

## 2019-04-22 DIAGNOSIS — Z510 Encounter for antineoplastic radiation therapy: Secondary | ICD-10-CM | POA: Diagnosis not present

## 2019-04-22 DIAGNOSIS — Z7901 Long term (current) use of anticoagulants: Secondary | ICD-10-CM

## 2019-04-22 DIAGNOSIS — M1990 Unspecified osteoarthritis, unspecified site: Secondary | ICD-10-CM

## 2019-04-22 DIAGNOSIS — Z79899 Other long term (current) drug therapy: Secondary | ICD-10-CM

## 2019-04-22 DIAGNOSIS — N1330 Unspecified hydronephrosis: Secondary | ICD-10-CM

## 2019-04-22 DIAGNOSIS — T451X5S Adverse effect of antineoplastic and immunosuppressive drugs, sequela: Secondary | ICD-10-CM

## 2019-04-22 DIAGNOSIS — C678 Malignant neoplasm of overlapping sites of bladder: Secondary | ICD-10-CM | POA: Diagnosis not present

## 2019-04-22 DIAGNOSIS — D6959 Other secondary thrombocytopenia: Secondary | ICD-10-CM | POA: Diagnosis not present

## 2019-04-22 DIAGNOSIS — R630 Anorexia: Secondary | ICD-10-CM

## 2019-04-22 DIAGNOSIS — I700 Atherosclerosis of aorta: Secondary | ICD-10-CM

## 2019-04-22 LAB — CMP (CANCER CENTER ONLY)
ALT: 16 U/L (ref 0–44)
AST: 15 U/L (ref 15–41)
Albumin: 4 g/dL (ref 3.5–5.0)
Alkaline Phosphatase: 70 U/L (ref 38–126)
Anion gap: 8 (ref 5–15)
BUN: 33 mg/dL — ABNORMAL HIGH (ref 8–23)
CO2: 26 mmol/L (ref 22–32)
Calcium: 8.6 mg/dL — ABNORMAL LOW (ref 8.9–10.3)
Chloride: 110 mmol/L (ref 98–111)
Creatinine: 1.54 mg/dL — ABNORMAL HIGH (ref 0.61–1.24)
GFR, Est AFR Am: 44 mL/min — ABNORMAL LOW (ref >60)
GFR, Estimated: 38 mL/min — ABNORMAL LOW (ref >60)
Glucose, Bld: 90 mg/dL (ref 70–99)
Potassium: 4.4 mmol/L (ref 3.5–5.1)
Sodium: 144 mmol/L (ref 135–145)
Total Bilirubin: 0.4 mg/dL (ref 0.3–1.2)
Total Protein: 6.8 g/dL (ref 6.5–8.1)

## 2019-04-22 LAB — CBC WITH DIFFERENTIAL (CANCER CENTER ONLY)
Abs Immature Granulocytes: 0.01 10*3/uL (ref 0.00–0.07)
Basophils Absolute: 0 10*3/uL (ref 0.0–0.1)
Basophils Relative: 0 %
Eosinophils Absolute: 0.2 10*3/uL (ref 0.0–0.5)
Eosinophils Relative: 7 %
HCT: 33.5 % — ABNORMAL LOW (ref 39.0–52.0)
Hemoglobin: 10.9 g/dL — ABNORMAL LOW (ref 13.0–17.0)
Immature Granulocytes: 0 %
Lymphocytes Relative: 13 %
Lymphs Abs: 0.4 10*3/uL — ABNORMAL LOW (ref 0.7–4.0)
MCH: 32.8 pg (ref 26.0–34.0)
MCHC: 32.5 g/dL (ref 30.0–36.0)
MCV: 100.9 fL — ABNORMAL HIGH (ref 80.0–100.0)
Monocytes Absolute: 0.3 10*3/uL (ref 0.1–1.0)
Monocytes Relative: 9 %
Neutro Abs: 2.2 10*3/uL (ref 1.7–7.7)
Neutrophils Relative %: 71 %
Platelet Count: 76 10*3/uL — ABNORMAL LOW (ref 150–400)
RBC: 3.32 MIL/uL — ABNORMAL LOW (ref 4.22–5.81)
RDW: 14.3 % (ref 11.5–15.5)
WBC Count: 3.1 10*3/uL — ABNORMAL LOW (ref 4.0–10.5)
nRBC: 0 % (ref 0.0–0.2)

## 2019-04-22 MED ORDER — DEXAMETHASONE SODIUM PHOSPHATE 10 MG/ML IJ SOLN
10.0000 mg | Freq: Once | INTRAMUSCULAR | Status: AC
  Administered 2019-04-22: 10 mg via INTRAVENOUS

## 2019-04-22 MED ORDER — PALONOSETRON HCL INJECTION 0.25 MG/5ML
0.2500 mg | Freq: Once | INTRAVENOUS | Status: AC
  Administered 2019-04-22: 0.25 mg via INTRAVENOUS

## 2019-04-22 MED ORDER — SODIUM CHLORIDE 0.9 % IV SOLN
55.6000 mg | Freq: Once | INTRAVENOUS | Status: AC
  Administered 2019-04-22: 60 mg via INTRAVENOUS
  Filled 2019-04-22: qty 6

## 2019-04-22 MED ORDER — PALONOSETRON HCL INJECTION 0.25 MG/5ML
INTRAVENOUS | Status: AC
  Filled 2019-04-22: qty 5

## 2019-04-22 MED ORDER — SODIUM CHLORIDE 0.9 % IV SOLN
Freq: Once | INTRAVENOUS | Status: AC
  Administered 2019-04-22: 13:00:00 via INTRAVENOUS
  Filled 2019-04-22: qty 250

## 2019-04-22 MED ORDER — DEXAMETHASONE SODIUM PHOSPHATE 10 MG/ML IJ SOLN
INTRAMUSCULAR | Status: AC
  Filled 2019-04-22: qty 1

## 2019-04-22 MED ORDER — MEGESTROL ACETATE 400 MG/10ML PO SUSP: 400.0000 mg | Freq: Two times a day (BID) | ORAL | 0 refills | Status: DC

## 2019-04-22 NOTE — Telephone Encounter (Signed)
Scheduled appt per 6/25 sch message - pt is aware of appt added

## 2019-04-22 NOTE — Progress Notes (Signed)
Hematology and Oncology Follow Up Visit  Terry Sullivan [id number] 27-Apr-1924 85 y.o. 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     Prior Therapy:  He is status post percutaneous biopsy of his bladder mass obtained on Mar 18, 2019 which confirmed the presence of urothelial carcinoma.  Current therapy: Definitive therapy with radiation as well as weekly carboplatin started on 04/01/2019.  He is here for cycle 4 of therapy.  Carboplatin dose was reduced to AUC of 1 because of cytopenias.  Interim History: [name] returns today for a repeat evaluation.  Since the last visit, he reports no major changes in his health.  He has continued to tolerate therapy without any major complaints.  He denies any nausea or bleeding complications.  He denies any vomiting or diarrhea.  He does report some abdominal distention at times and occasional discomfort but he does not feel any pain today.  He is moving his bowels regularly without any issues.  He denies any hematochezia or melena.  He does report some fatigue associated with therapy.  His appetite remains marginal and of lost more weight since the last visit.  Patient denied any alteration mental status, neuropathy, confusion or dizziness.  Denies any headaches or lethargy.  Denies any night sweats, weight loss or changes in appetite.  Denied orthopnea, dyspnea on exertion or chest discomfort.  Denies shortness of breath, difficulty breathing hemoptysis or cough.  Denies any abdominal distention, nausea, early satiety or dyspepsia.  Denies any hematuria, frequency, dysuria or nocturia.  Denies any skin irritation, dryness or rash.  Denies any ecchymosis or petechiae.  Denies any lymphadenopathy or clotting.  Denies any heat or cold intolerance.  Denies any anxiety or depression.   Remaining review of system is negative.        Medications: I have reviewed the patient's current medications.  Current Outpatient Medications  Medication Sig Dispense Refill  . acetaminophen (TYLENOL) 500 MG tablet Take 500 mg by mouth 2 (two) times daily.    Marland Kitchen aspirin EC 81 MG tablet Take 1 tablet (81 mg total) by mouth daily. 90 tablet 3  . cetirizine (ZYRTEC) 10 MG tablet Take by mouth as needed.    . doxazosin (CARDURA) 8 MG tablet Take 4 mg by mouth daily.    . fluticasone (FLONASE) 50 MCG/ACT nasal spray INSTILL 2 SPRAYS INTO EACH NOSTRIL ONCE DAILY IF NEEDED    . gabapentin (NEURONTIN) 100 MG capsule Take 1 capsule by mouth 2 (two) times daily.    . OXYCONTIN 10 MG 12 hr tablet Take 1 tablet by mouth 2 (two) times daily.    . prochlorperazine (COMPAZINE) 10 MG tablet Take 1 tablet (10 mg total) by mouth every 6 (six) hours as needed for nausea or vomiting. 30 tablet 0  . tobramycin-dexamethasone (TOBRADEX) ophthalmic solution Instill one drop OD QID x 5 days     No current facility-administered medications for this visit.      Allergies: No Known Allergies  Past Medical History, Surgical history, Social history, and Family History were reviewed and updated.    Physical Exam: Blood pressure 136/80, pulse 81, temperature 98.3 F (36.8 C), temperature source Oral, resp. rate 18, height 5\' 7"  (1.702 m), weight 154 lb 6.4 oz (70 kg), SpO2 98 %.   ECOG: 1   General appearance: Comfortable appearing without any discomfort Head: Normocephalic  without any trauma Oropharynx: Mucous membranes are moist and pink without any thrush or ulcers. Eyes: Pupils are equal and round reactive to light. Lymph nodes: No cervical, supraclavicular, inguinal or axillary lymphadenopathy.   Heart:regular rate and rhythm.  S1 and S2 without leg edema. Lung: Clear without any rhonchi or wheezes.  No dullness to percussion. Abdomin: Soft, nontender, nondistended with good bowel sounds.  No  hepatosplenomegaly. Musculoskeletal: No joint deformity or effusion.  Full range of motion noted. Neurological: No deficits noted on motor, sensory and deep tendon reflex exam. Skin: No petechial rash or dryness.  Appeared moist.      Lab Results: Lab Results  Component Value Date   WBC 4.8 04/15/2019   HGB 10.9 (L) 04/15/2019   HCT 33.9 (L) 04/15/2019   MCV 101.2 (H) 04/15/2019   PLT 93 (L) 04/15/2019     Chemistry      Component Value Date/Time   NA 143 04/15/2019 1251   K 4.5 04/15/2019 1251   CL 108 04/15/2019 1251   CO2 23 04/15/2019 1251   BUN 42 (H) 04/15/2019 1251   CREATININE 1.75 (H) 04/15/2019 1251      Component Value Date/Time   CALCIUM 8.5 (L) 04/15/2019 1251   ALKPHOS 81 04/15/2019 1251   AST 16 04/15/2019 1251   ALT 17 04/15/2019 1251   BILITOT 0.4 04/15/2019 1251      Impression and Plan:  85 year old man with:   1.    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   He is currently receiving definitive therapy with radiation and weekly carboplatin without any major complications.  Risks and benefits of continuing this therapy and the role for systemic therapy was outlined again.  Potential complications at this time include diarrhea, dysuria, fatigue and myelosuppression were reviewed.  He is agreeable to continue with plan to complete 6 weeks of Paraplatin.  2.  Chronic anticoagulation with Eliquis: No bleeding noted at this time.  3.  Hydronephrosis: Creatinine continues to improve at this time without any recent exacerbation.  4.  Pain: Very little noted that at this time.  5.  Anorexia: Prescription for Megace will be given to him with instructions how to use it.  We also discussed strategies to boost his nutritional intake.  6.  Goals of care and prognosis: Therapy remains palliative although continued aggressive measures are reasonable at this time with his intact performance status.  7.  Thrombocytopenia:  Related to carboplatin without any active bleeding at this time.  Dose modification may be needed in the future.   8.  Follow-up: He will continue to have follow-up on a weekly basis for laboratory testing before chemotherapy.  MD follow-up in 3 to 4 weeks.  25  minutes was spent with the patient face-to-face today.  More than 50% of time was dedicated to discussing his disease status, reviewing laboratory data, complications related therapy and future plan of care.   [name], MD 6/25/202012:28 PM

## 2019-04-22 NOTE — Progress Notes (Signed)
Per Dr. [name] it is ok to treat with today's lab results.

## 2019-04-22 NOTE — Patient Instructions (Signed)
[facility] Center Discharge Instructions for Patients Receiving Chemotherapy  Today you received the following chemotherapy agents Carboplatin  To help prevent nausea and vomiting after your treatment, we encourage you to take your nausea medication as directed   If you develop nausea and vomiting that is not controlled by your nausea medication, call the clinic.   BELOW ARE SYMPTOMS THAT SHOULD BE REPORTED IMMEDIATELY:  *FEVER GREATER THAN 100.5 F  *CHILLS WITH OR WITHOUT FEVER  NAUSEA AND VOMITING THAT IS NOT CONTROLLED WITH YOUR NAUSEA MEDICATION  *UNUSUAL SHORTNESS OF BREATH  *UNUSUAL BRUISING OR BLEEDING  TENDERNESS IN MOUTH AND THROAT WITH OR WITHOUT PRESENCE OF ULCERS  *URINARY PROBLEMS  *BOWEL PROBLEMS  UNUSUAL RASH Items with * indicate a potential emergency and should be followed up as soon as possible.  Feel free to call the clinic should you have any questions or concerns. The clinic phone number is [phone].  Please show the CHEMO ALERT CARD at check-in to the Emergency Department and triage nurse.   

## 2019-04-23 ENCOUNTER — Ambulatory Visit
Admission: RE | Admit: 2019-04-23 | Discharge: 2019-04-23 | Disposition: A | Discharge status: Home / Self Care | Payer: Medicare Other | Source: Ambulatory Visit | Attending: Radiation Oncology | Admitting: Radiation Oncology

## 2019-04-23 ENCOUNTER — Other Ambulatory Visit: Payer: Self-pay

## 2019-04-23 ENCOUNTER — Telehealth: Payer: Self-pay | Admitting: Oncology

## 2019-04-23 DIAGNOSIS — Z510 Encounter for antineoplastic radiation therapy: Secondary | ICD-10-CM | POA: Diagnosis not present

## 2019-04-23 NOTE — Telephone Encounter (Signed)
No 6/25 los

## 2019-04-26 ENCOUNTER — Other Ambulatory Visit: Payer: Self-pay

## 2019-04-26 ENCOUNTER — Ambulatory Visit
Admission: RE | Admit: 2019-04-26 | Discharge: 2019-04-26 | Disposition: A | Discharge status: Home / Self Care | Payer: Medicare Other | Source: Ambulatory Visit | Attending: Radiation Oncology | Admitting: Radiation Oncology

## 2019-04-26 ENCOUNTER — Telehealth: Payer: Self-pay | Admitting: *Deleted

## 2019-04-26 DIAGNOSIS — Z510 Encounter for antineoplastic radiation therapy: Secondary | ICD-10-CM | POA: Diagnosis not present

## 2019-04-26 NOTE — Telephone Encounter (Signed)
Received PA for megace from [employer].  Placed in PA box.

## 2019-04-27 ENCOUNTER — Ambulatory Visit
Admission: RE | Admit: 2019-04-27 | Discharge: 2019-04-27 | Disposition: A | Discharge status: Home / Self Care | Payer: Medicare Other | Source: Ambulatory Visit | Attending: Radiation Oncology | Admitting: Radiation Oncology

## 2019-04-27 ENCOUNTER — Other Ambulatory Visit: Payer: Self-pay

## 2019-04-27 DIAGNOSIS — Z510 Encounter for antineoplastic radiation therapy: Secondary | ICD-10-CM | POA: Diagnosis not present

## 2019-04-28 ENCOUNTER — Other Ambulatory Visit: Payer: Self-pay

## 2019-04-28 ENCOUNTER — Ambulatory Visit
Admission: RE | Admit: 2019-04-28 | Discharge: 2019-04-28 | Disposition: A | Discharge status: Home / Self Care | Payer: Medicare Other | Source: Ambulatory Visit | Attending: Radiation Oncology | Admitting: Radiation Oncology

## 2019-04-28 DIAGNOSIS — C672 Malignant neoplasm of lateral wall of bladder: Secondary | ICD-10-CM | POA: Diagnosis not present

## 2019-04-28 DIAGNOSIS — Z510 Encounter for antineoplastic radiation therapy: Secondary | ICD-10-CM | POA: Insufficient documentation

## 2019-04-29 ENCOUNTER — Other Ambulatory Visit: Payer: Self-pay

## 2019-04-29 ENCOUNTER — Inpatient Hospital Stay: Payer: Medicare Other

## 2019-04-29 ENCOUNTER — Inpatient Hospital Stay: Payer: Medicare Other | Attending: Oncology

## 2019-04-29 ENCOUNTER — Other Ambulatory Visit: Payer: Self-pay | Admitting: Oncology

## 2019-04-29 ENCOUNTER — Ambulatory Visit
Admission: RE | Admit: 2019-04-29 | Discharge: 2019-04-29 | Disposition: A | Discharge status: Home / Self Care | Payer: Medicare Other | Source: Ambulatory Visit | Attending: Radiation Oncology | Admitting: Radiation Oncology

## 2019-04-29 DIAGNOSIS — Z5111 Encounter for antineoplastic chemotherapy: Secondary | ICD-10-CM | POA: Insufficient documentation

## 2019-04-29 DIAGNOSIS — Z79899 Other long term (current) drug therapy: Secondary | ICD-10-CM | POA: Diagnosis not present

## 2019-04-29 DIAGNOSIS — N1330 Unspecified hydronephrosis: Secondary | ICD-10-CM | POA: Diagnosis not present

## 2019-04-29 DIAGNOSIS — C678 Malignant neoplasm of overlapping sites of bladder: Secondary | ICD-10-CM | POA: Insufficient documentation

## 2019-04-29 DIAGNOSIS — D6959 Other secondary thrombocytopenia: Secondary | ICD-10-CM | POA: Diagnosis not present

## 2019-04-29 DIAGNOSIS — R5383 Other fatigue: Secondary | ICD-10-CM | POA: Diagnosis not present

## 2019-04-29 DIAGNOSIS — Z7982 Long term (current) use of aspirin: Secondary | ICD-10-CM | POA: Diagnosis not present

## 2019-04-29 DIAGNOSIS — Z510 Encounter for antineoplastic radiation therapy: Secondary | ICD-10-CM | POA: Diagnosis not present

## 2019-04-29 DIAGNOSIS — R109 Unspecified abdominal pain: Secondary | ICD-10-CM | POA: Insufficient documentation

## 2019-04-29 DIAGNOSIS — T451X5S Adverse effect of antineoplastic and immunosuppressive drugs, sequela: Secondary | ICD-10-CM | POA: Diagnosis not present

## 2019-04-29 DIAGNOSIS — Z7901 Long term (current) use of anticoagulants: Secondary | ICD-10-CM | POA: Insufficient documentation

## 2019-04-29 DIAGNOSIS — M549 Dorsalgia, unspecified: Secondary | ICD-10-CM | POA: Diagnosis not present

## 2019-04-29 DIAGNOSIS — R630 Anorexia: Secondary | ICD-10-CM | POA: Diagnosis not present

## 2019-04-29 LAB — CBC WITH DIFFERENTIAL (CANCER CENTER ONLY)
Abs Immature Granulocytes: 0.01 10*3/uL (ref 0.00–0.07)
Basophils Absolute: 0 10*3/uL (ref 0.0–0.1)
Basophils Relative: 1 %
Eosinophils Absolute: 0.3 10*3/uL (ref 0.0–0.5)
Eosinophils Relative: 8 %
HCT: 30.9 % — ABNORMAL LOW (ref 39.0–52.0)
Hemoglobin: 10.5 g/dL — ABNORMAL LOW (ref 13.0–17.0)
Immature Granulocytes: 0 %
Lymphocytes Relative: 13 %
Lymphs Abs: 0.4 10*3/uL — ABNORMAL LOW (ref 0.7–4.0)
MCH: 33.7 pg (ref 26.0–34.0)
MCHC: 34 g/dL (ref 30.0–36.0)
MCV: 99 fL (ref 80.0–100.0)
Monocytes Absolute: 0.3 10*3/uL (ref 0.1–1.0)
Monocytes Relative: 9 %
Neutro Abs: 2.2 10*3/uL (ref 1.7–7.7)
Neutrophils Relative %: 69 %
Platelet Count: 72 10*3/uL — ABNORMAL LOW (ref 150–400)
RBC: 3.12 MIL/uL — ABNORMAL LOW (ref 4.22–5.81)
RDW: 14.9 % (ref 11.5–15.5)
WBC Count: 3.2 10*3/uL — ABNORMAL LOW (ref 4.0–10.5)
nRBC: 0 % (ref 0.0–0.2)

## 2019-04-29 LAB — CMP (CANCER CENTER ONLY)
ALT: 20 U/L (ref 0–44)
AST: 15 U/L (ref 15–41)
Albumin: 3.7 g/dL (ref 3.5–5.0)
Alkaline Phosphatase: 76 U/L (ref 38–126)
Anion gap: 9 (ref 5–15)
BUN: 32 mg/dL — ABNORMAL HIGH (ref 8–23)
CO2: 23 mmol/L (ref 22–32)
Calcium: 8.4 mg/dL — ABNORMAL LOW (ref 8.9–10.3)
Chloride: 108 mmol/L (ref 98–111)
Creatinine: 1.25 mg/dL — ABNORMAL HIGH (ref 0.61–1.24)
GFR, Est AFR Am: 57 mL/min — ABNORMAL LOW (ref >60)
GFR, Estimated: 49 mL/min — ABNORMAL LOW (ref >60)
Glucose, Bld: 97 mg/dL (ref 70–99)
Potassium: 4.4 mmol/L (ref 3.5–5.1)
Sodium: 140 mmol/L (ref 135–145)
Total Bilirubin: 0.4 mg/dL (ref 0.3–1.2)
Total Protein: 6.5 g/dL (ref 6.5–8.1)

## 2019-04-29 NOTE — Patient Instructions (Signed)
Thrombocytopenia Thrombocytopenia means that you have a low number of platelets in your blood. Platelets are tiny cells in the blood. When you bleed, they clump together at the cut or injury to stop the bleeding. This is called blood clotting. If you do not have enough platelets, it can cause bleeding problems. Some cases of this condition are mild while others are more severe. What are the causes? This condition may be caused by:  Your body not making enough platelets. This may be caused by: ? Your bone marrow not making blood cells (aplastic anemia). ? Cancer in the bone marrow. ? Certain medicines. ? Infection in the bone marrow. ? Drinking a lot of alcohol.  Your body destroying platelets too quickly. This may be caused by: ? Certain immune diseases. ? Certain medicines. ? Certain blood clotting disorders. ? Certain disorders that are passed from parent to child (inherited). ? Certain bleeding disorders. ? Pregnancy. ? Having a spleen that is larger than normal. What are the signs or symptoms?  Bleeding that is not normal.  Nosebleeds.  Heavy menstrual periods.  Blood in the pee (urine) or poop (stool).  A purple-like color to the skin (purpura).  Bruising.  A rash that looks like pinpoint, purple-red spots (petechiae). How is this treated?  Treatment of another condition that is causing the low platelet count.  Medicines to help protect your platelets from being destroyed.  A replacement (transfusion) of platelets to stop or prevent bleeding.  Surgery to remove the spleen. Follow these instructions at home: Activity  Avoid activities that could cause you to get hurt or bruised. Follow instructions about how to prevent falls.  Take care not to cut yourself: ? When you shave. ? When you use scissors, needles, knives, or other tools.  Take care not to burn yourself: ? When you use an iron. ? When you cook. General instructions   Check your skin and the  inside of your mouth for bruises or blood as told by your doctor.  Check to see if there is blood in your spit (sputum), pee, and poop. Do this as told by your doctor.  Do not drink alcohol.  Take over-the-counter and prescription medicines only as told by your doctor.  Do not take any medicines that have aspirin or NSAIDs in them. These medicines can thin your blood and cause you to bleed.  Tell all of your doctors that you have this condition. Be sure to tell your dentist and eye doctor too. Contact a doctor if:  You have bruises and you do not know why. Get help right away if:  You are bleeding anywhere on your body.  You have blood in your spit, pee, or poop. Summary  Thrombocytopenia means that you have a low number of platelets in your blood.  Platelets are needed for blood clotting.  Symptoms of this condition include bleeding that is not normal, and bruising.  Take care not to cut or burn yourself. This information is not intended to replace advice given to you by your health care provider. Make sure you discuss any questions you have with your health care provider. Document Released: 10/03/2011 Document Revised: 07/16/2018 Document Reviewed: 07/16/2018 Elsevier Patient Education  2020 [employer]  

## 2019-04-29 NOTE — Progress Notes (Signed)
CBC from today reviewed with a platelet count persistently in the 70 range without active bleeding.  I have opted to hold carboplatin dose for today and resume chemotherapy in 1 week.  It carboplatin dose will stay the same and he cut off for his platelet count to treat would be 80,000.  If his platelet count on 05/06/2019 is above 80,000 he will receive a last carboplatin dose.  He has MD follow-up on 05/13/2019 to coordinate his future plan of care.

## 2019-04-29 NOTE — Progress Notes (Signed)
Per Dr. [name] hold treatment today due to plt of 72.  Pt educated on thrombocytopenia and given discharge instructions.

## 2019-05-03 ENCOUNTER — Other Ambulatory Visit: Payer: Self-pay

## 2019-05-03 ENCOUNTER — Ambulatory Visit
Admission: RE | Admit: 2019-05-03 | Discharge: 2019-05-03 | Disposition: A | Discharge status: Home / Self Care | Payer: Medicare Other | Source: Ambulatory Visit | Attending: Radiation Oncology | Admitting: Radiation Oncology

## 2019-05-03 DIAGNOSIS — Z510 Encounter for antineoplastic radiation therapy: Secondary | ICD-10-CM | POA: Diagnosis not present

## 2019-05-04 ENCOUNTER — Other Ambulatory Visit: Payer: Self-pay | Admitting: *Deleted

## 2019-05-04 ENCOUNTER — Other Ambulatory Visit: Payer: Self-pay

## 2019-05-04 ENCOUNTER — Ambulatory Visit
Admission: RE | Admit: 2019-05-04 | Discharge: 2019-05-04 | Disposition: A | Discharge status: Home / Self Care | Payer: Medicare Other | Source: Ambulatory Visit | Attending: Radiation Oncology | Admitting: Radiation Oncology

## 2019-05-04 DIAGNOSIS — Z510 Encounter for antineoplastic radiation therapy: Secondary | ICD-10-CM | POA: Diagnosis not present

## 2019-05-05 ENCOUNTER — Other Ambulatory Visit: Payer: Self-pay

## 2019-05-05 ENCOUNTER — Ambulatory Visit
Admission: RE | Admit: 2019-05-05 | Discharge: 2019-05-05 | Disposition: A | Discharge status: Home / Self Care | Payer: Medicare Other | Source: Ambulatory Visit | Attending: Radiation Oncology | Admitting: Radiation Oncology

## 2019-05-05 DIAGNOSIS — Z510 Encounter for antineoplastic radiation therapy: Secondary | ICD-10-CM | POA: Diagnosis not present

## 2019-05-06 ENCOUNTER — Inpatient Hospital Stay: Payer: Medicare Other

## 2019-05-06 ENCOUNTER — Other Ambulatory Visit: Payer: Self-pay

## 2019-05-06 ENCOUNTER — Telehealth: Payer: Self-pay

## 2019-05-06 ENCOUNTER — Ambulatory Visit
Admission: RE | Admit: 2019-05-06 | Discharge: 2019-05-06 | Disposition: A | Discharge status: Home / Self Care | Payer: Medicare Other | Source: Ambulatory Visit | Attending: Radiation Oncology | Admitting: Radiation Oncology

## 2019-05-06 DIAGNOSIS — Z510 Encounter for antineoplastic radiation therapy: Secondary | ICD-10-CM | POA: Diagnosis not present

## 2019-05-06 DIAGNOSIS — C678 Malignant neoplasm of overlapping sites of bladder: Secondary | ICD-10-CM | POA: Diagnosis not present

## 2019-05-06 LAB — CBC WITH DIFFERENTIAL (CANCER CENTER ONLY)
Abs Immature Granulocytes: 0.02 10*3/uL (ref 0.00–0.07)
Basophils Absolute: 0 10*3/uL (ref 0.0–0.1)
Basophils Relative: 0 %
Eosinophils Absolute: 0.1 10*3/uL (ref 0.0–0.5)
Eosinophils Relative: 1 %
HCT: 28.8 % — ABNORMAL LOW (ref 39.0–52.0)
Hemoglobin: 10.2 g/dL — ABNORMAL LOW (ref 13.0–17.0)
Immature Granulocytes: 1 %
Lymphocytes Relative: 11 %
Lymphs Abs: 0.4 10*3/uL — ABNORMAL LOW (ref 0.7–4.0)
MCH: 33.6 pg (ref 26.0–34.0)
MCHC: 35.4 g/dL (ref 30.0–36.0)
MCV: 94.7 fL (ref 80.0–100.0)
Monocytes Absolute: 0.3 10*3/uL (ref 0.1–1.0)
Monocytes Relative: 9 %
Neutro Abs: 2.7 10*3/uL (ref 1.7–7.7)
Neutrophils Relative %: 78 %
Platelet Count: 75 10*3/uL — ABNORMAL LOW (ref 150–400)
RBC: 3.04 MIL/uL — ABNORMAL LOW (ref 4.22–5.81)
RDW: 15.2 % (ref 11.5–15.5)
WBC Count: 3.5 10*3/uL — ABNORMAL LOW (ref 4.0–10.5)
nRBC: 0 % (ref 0.0–0.2)

## 2019-05-06 LAB — CMP (CANCER CENTER ONLY)
ALT: 14 U/L (ref 0–44)
AST: 15 U/L (ref 15–41)
Albumin: 3.9 g/dL (ref 3.5–5.0)
Alkaline Phosphatase: 72 U/L (ref 38–126)
Anion gap: 11 (ref 5–15)
BUN: 31 mg/dL — ABNORMAL HIGH (ref 8–23)
CO2: 22 mmol/L (ref 22–32)
Calcium: 8.8 mg/dL — ABNORMAL LOW (ref 8.9–10.3)
Chloride: 108 mmol/L (ref 98–111)
Creatinine: 1.39 mg/dL — ABNORMAL HIGH (ref 0.61–1.24)
GFR, Est AFR Am: 50 mL/min — ABNORMAL LOW (ref >60)
GFR, Estimated: 43 mL/min — ABNORMAL LOW (ref >60)
Glucose, Bld: 85 mg/dL (ref 70–99)
Potassium: 4 mmol/L (ref 3.5–5.1)
Sodium: 141 mmol/L (ref 135–145)
Total Bilirubin: 0.5 mg/dL (ref 0.3–1.2)
Total Protein: 6.7 g/dL (ref 6.5–8.1)

## 2019-05-06 NOTE — Patient Instructions (Signed)
Your treatment for today has been cancelled due to low platelets. Dr. [name] will see you again next week. Have a great weekend!  Thrombocytopenia Thrombocytopenia means that you have a low number of platelets in your blood. Platelets are tiny cells in the blood. When you bleed, they clump together at the cut or injury to stop the bleeding. This is called blood clotting. If you do not have enough platelets, it can cause bleeding problems. Some cases of this condition are mild while others are more severe. What are the causes? This condition may be caused by:  Your body not making enough platelets. This may be caused by: ? Your bone marrow not making blood cells (aplastic anemia). ? Cancer in the bone marrow. ? Certain medicines. ? Infection in the bone marrow. ? Drinking a lot of alcohol.  Your body destroying platelets too quickly. This may be caused by: ? Certain immune diseases. ? Certain medicines. ? Certain blood clotting disorders. ? Certain disorders that are passed from parent to child (inherited). ? Certain bleeding disorders. ? Pregnancy. ? Having a spleen that is larger than normal. What are the signs or symptoms?  Bleeding that is not normal.  Nosebleeds.  Heavy menstrual periods.  Blood in the pee (urine) or poop (stool).  A purple-like color to the skin (purpura).  Bruising.  A rash that looks like pinpoint, purple-red spots (petechiae). How is this treated?  Treatment of another condition that is causing the low platelet count.  Medicines to help protect your platelets from being destroyed.  A replacement (transfusion) of platelets to stop or prevent bleeding.  Surgery to remove the spleen. Follow these instructions at home: Activity  Avoid activities that could cause you to get hurt or bruised. Follow instructions about how to prevent falls.  Take care not to cut yourself: ? When you shave. ? When you use scissors, needles, knives, or other tools.   Take care not to burn yourself: ? When you use an iron. ? When you cook. General instructions   Check your skin and the inside of your mouth for bruises or blood as told by your doctor.  Check to see if there is blood in your spit (sputum), pee, and poop. Do this as told by your doctor.  Do not drink alcohol.  Take over-the-counter and prescription medicines only as told by your doctor.  Do not take any medicines that have aspirin or NSAIDs in them. These medicines can thin your blood and cause you to bleed.  Tell all of your doctors that you have this condition. Be sure to tell your dentist and eye doctor too. Contact a doctor if:  You have bruises and you do not know why. Get help right away if:  You are bleeding anywhere on your body.  You have blood in your spit, pee, or poop. Summary  Thrombocytopenia means that you have a low number of platelets in your blood.  Platelets are needed for blood clotting.  Symptoms of this condition include bleeding that is not normal, and bruising.  Take care not to cut or burn yourself. This information is not intended to replace advice given to you by your health care provider. Make sure you discuss any questions you have with your health care provider. Document Released: 10/03/2011 Document Revised: 07/16/2018 Document Reviewed: 07/16/2018 Elsevier Patient Education  2020 [employer].

## 2019-05-06 NOTE — Telephone Encounter (Signed)
Patient called regarding taking radiation and chemo was advised to stop taking eliquis start taking aspirin patient stated that he is concerned if aspirin is okay to take please advise?

## 2019-05-06 NOTE — Progress Notes (Signed)
Per Dr. Hazeline Junker previous note, patient's tx will be cancelled today as platelets are below 80,000. Dr. Jana Hakim (on call) in agreement with cancelling tx today.

## 2019-05-07 ENCOUNTER — Ambulatory Visit
Admission: RE | Admit: 2019-05-07 | Discharge: 2019-05-07 | Disposition: A | Discharge status: Home / Self Care | Payer: Medicare Other | Source: Ambulatory Visit | Attending: Radiation Oncology | Admitting: Radiation Oncology

## 2019-05-07 ENCOUNTER — Telehealth: Payer: Self-pay

## 2019-05-07 ENCOUNTER — Other Ambulatory Visit: Payer: Self-pay

## 2019-05-07 DIAGNOSIS — Z510 Encounter for antineoplastic radiation therapy: Secondary | ICD-10-CM | POA: Diagnosis not present

## 2019-05-07 NOTE — Telephone Encounter (Signed)
Pt wants to know your recommendations on taking 81 mg aspirin because his Platelets are low and he cannot do the chemo.

## 2019-05-07 NOTE — Telephone Encounter (Signed)
Lvm for pt stating above recommendation from Dr. Woody Seller.

## 2019-05-10 ENCOUNTER — Ambulatory Visit
Admission: RE | Admit: 2019-05-10 | Discharge: 2019-05-10 | Disposition: A | Discharge status: Home / Self Care | Payer: Medicare Other | Source: Ambulatory Visit | Attending: Radiation Oncology | Admitting: Radiation Oncology

## 2019-05-10 ENCOUNTER — Other Ambulatory Visit: Payer: Self-pay

## 2019-05-10 DIAGNOSIS — Z510 Encounter for antineoplastic radiation therapy: Secondary | ICD-10-CM | POA: Diagnosis not present

## 2019-05-11 ENCOUNTER — Ambulatory Visit
Admission: RE | Admit: 2019-05-11 | Discharge: 2019-05-11 | Disposition: A | Discharge status: Home / Self Care | Payer: Medicare Other | Source: Ambulatory Visit | Attending: Radiation Oncology | Admitting: Radiation Oncology

## 2019-05-11 ENCOUNTER — Other Ambulatory Visit: Payer: Self-pay

## 2019-05-11 DIAGNOSIS — Z510 Encounter for antineoplastic radiation therapy: Secondary | ICD-10-CM | POA: Diagnosis not present

## 2019-05-12 ENCOUNTER — Ambulatory Visit
Admission: RE | Admit: 2019-05-12 | Discharge: 2019-05-12 | Disposition: A | Discharge status: Home / Self Care | Payer: Medicare Other | Source: Ambulatory Visit | Attending: Radiation Oncology | Admitting: Radiation Oncology

## 2019-05-12 ENCOUNTER — Other Ambulatory Visit: Payer: Self-pay

## 2019-05-12 DIAGNOSIS — Z510 Encounter for antineoplastic radiation therapy: Secondary | ICD-10-CM | POA: Diagnosis not present

## 2019-05-13 ENCOUNTER — Ambulatory Visit
Admission: RE | Admit: 2019-05-13 | Discharge: 2019-05-13 | Disposition: A | Discharge status: Home / Self Care | Payer: Medicare Other | Source: Ambulatory Visit | Attending: Radiation Oncology | Admitting: Radiation Oncology

## 2019-05-13 ENCOUNTER — Inpatient Hospital Stay: Payer: Medicare Other

## 2019-05-13 ENCOUNTER — Inpatient Hospital Stay (HOSPITAL_BASED_OUTPATIENT_CLINIC_OR_DEPARTMENT_OTHER): Payer: Medicare Other | Admitting: Oncology

## 2019-05-13 ENCOUNTER — Telehealth: Payer: Self-pay | Admitting: Oncology

## 2019-05-13 ENCOUNTER — Other Ambulatory Visit: Payer: Self-pay

## 2019-05-13 VITALS — BP 121/73 | HR 83 | Temp 98.9°F | Resp 18 | Ht 67.0 in | Wt 155.4 lb

## 2019-05-13 DIAGNOSIS — R630 Anorexia: Secondary | ICD-10-CM

## 2019-05-13 DIAGNOSIS — T451X5S Adverse effect of antineoplastic and immunosuppressive drugs, sequela: Secondary | ICD-10-CM

## 2019-05-13 DIAGNOSIS — D6959 Other secondary thrombocytopenia: Secondary | ICD-10-CM

## 2019-05-13 DIAGNOSIS — Z7982 Long term (current) use of aspirin: Secondary | ICD-10-CM

## 2019-05-13 DIAGNOSIS — C678 Malignant neoplasm of overlapping sites of bladder: Secondary | ICD-10-CM | POA: Diagnosis not present

## 2019-05-13 DIAGNOSIS — Z79899 Other long term (current) drug therapy: Secondary | ICD-10-CM

## 2019-05-13 DIAGNOSIS — C672 Malignant neoplasm of lateral wall of bladder: Secondary | ICD-10-CM

## 2019-05-13 DIAGNOSIS — M549 Dorsalgia, unspecified: Secondary | ICD-10-CM

## 2019-05-13 DIAGNOSIS — R5383 Other fatigue: Secondary | ICD-10-CM | POA: Diagnosis not present

## 2019-05-13 DIAGNOSIS — Z7901 Long term (current) use of anticoagulants: Secondary | ICD-10-CM

## 2019-05-13 DIAGNOSIS — R109 Unspecified abdominal pain: Secondary | ICD-10-CM

## 2019-05-13 DIAGNOSIS — Z510 Encounter for antineoplastic radiation therapy: Secondary | ICD-10-CM | POA: Diagnosis not present

## 2019-05-13 DIAGNOSIS — N1330 Unspecified hydronephrosis: Secondary | ICD-10-CM

## 2019-05-13 LAB — CBC WITH DIFFERENTIAL (CANCER CENTER ONLY)
Abs Immature Granulocytes: 0.02 10*3/uL (ref 0.00–0.07)
Basophils Absolute: 0 10*3/uL (ref 0.0–0.1)
Basophils Relative: 0 %
Eosinophils Absolute: 0.1 10*3/uL (ref 0.0–0.5)
Eosinophils Relative: 1 %
HCT: 29.2 % — ABNORMAL LOW (ref 39.0–52.0)
Hemoglobin: 10 g/dL — ABNORMAL LOW (ref 13.0–17.0)
Immature Granulocytes: 1 %
Lymphocytes Relative: 13 %
Lymphs Abs: 0.5 10*3/uL — ABNORMAL LOW (ref 0.7–4.0)
MCH: 33.4 pg (ref 26.0–34.0)
MCHC: 34.2 g/dL (ref 30.0–36.0)
MCV: 97.7 fL (ref 80.0–100.0)
Monocytes Absolute: 0.4 10*3/uL (ref 0.1–1.0)
Monocytes Relative: 12 %
Neutro Abs: 2.5 10*3/uL (ref 1.7–7.7)
Neutrophils Relative %: 73 %
Platelet Count: 116 10*3/uL — ABNORMAL LOW (ref 150–400)
RBC: 2.99 MIL/uL — ABNORMAL LOW (ref 4.22–5.81)
RDW: 16.1 % — ABNORMAL HIGH (ref 11.5–15.5)
WBC Count: 3.5 10*3/uL — ABNORMAL LOW (ref 4.0–10.5)
nRBC: 0 % (ref 0.0–0.2)

## 2019-05-13 LAB — CMP (CANCER CENTER ONLY)
ALT: 11 U/L (ref 0–44)
AST: 14 U/L — ABNORMAL LOW (ref 15–41)
Albumin: 4 g/dL (ref 3.5–5.0)
Alkaline Phosphatase: 61 U/L (ref 38–126)
Anion gap: 8 (ref 5–15)
BUN: 35 mg/dL — ABNORMAL HIGH (ref 8–23)
CO2: 22 mmol/L (ref 22–32)
Calcium: 9.1 mg/dL (ref 8.9–10.3)
Chloride: 110 mmol/L (ref 98–111)
Creatinine: 1.39 mg/dL — ABNORMAL HIGH (ref 0.61–1.24)
GFR, Est AFR Am: 50 mL/min — ABNORMAL LOW (ref >60)
GFR, Estimated: 43 mL/min — ABNORMAL LOW (ref >60)
Glucose, Bld: 104 mg/dL — ABNORMAL HIGH (ref 70–99)
Potassium: 4.1 mmol/L (ref 3.5–5.1)
Sodium: 140 mmol/L (ref 135–145)
Total Bilirubin: 0.5 mg/dL (ref 0.3–1.2)
Total Protein: 6.8 g/dL (ref 6.5–8.1)

## 2019-05-13 NOTE — Progress Notes (Signed)
Hematology and Oncology Follow Up Visit  Terry Sullivan [id number] 1924/07/17 85 y.o. 05/13/2019 12:05 PM Terry Sullivan, MDGriffin, Terry Reichmann, MD   Principle Diagnosis: 85 year old man with bladder cancer diagnosed in May 2020.  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   Prior Therapy:  He is status post percutaneous biopsy of his bladder mass obtained on Mar 18, 2019 which confirmed the presence of urothelial carcinoma.  Current therapy: Definitive therapy with radiation as well as weekly carboplatin started on 04/01/2019.  He is receiving weekly carboplatin which has been on hold for the last 2 weeks because of thrombocytopenia.  He will receive his last cycle of chemotherapy on 05/20/2019.  Interim History: Terry Sullivan is here for a follow-up.  Since last visit he reports no major changes in his health.  He does report some mild fatigue and tiredness but overall has tolerated therapy without any major complaints.  He denies any bleeding complications occluding hematochezia, melena or hemoptysis.  He does report some pelvic and back pain that has been chronic in nature.  He does not report any abdominal pain or discomfort at this time.  He is eating better and has gained weight.  He denied headaches, blurry vision, syncope or seizures.  Denies any fevers, chills or sweats.  Denied chest pain, palpitation, orthopnea or leg edema.  Denied cough, wheezing or hemoptysis.  Denied nausea, vomiting or abdominal pain.  Denies any constipation or diarrhea.  Denies any frequency urgency or hesitancy.  Denies any arthralgias or myalgias.  Denies any skin rashes or lesions.  Denies any bleeding or clotting tendency.  Denies any easy bruising.  Denies any hair or nail changes.  Denies any anxiety or depression.  Remaining review of system is negative.        Medications: Updated today without any changes. Current Outpatient Medications   Medication Sig Dispense Refill  . acetaminophen (TYLENOL) 500 MG tablet Take 500 mg by mouth 2 (two) times daily.    Marland Kitchen aspirin EC 81 MG tablet Take 1 tablet (81 mg total) by mouth daily. 90 tablet 3  . cetirizine (ZYRTEC) 10 MG tablet Take by mouth as needed.    . doxazosin (CARDURA) 8 MG tablet Take 4 mg by mouth daily.    . fluticasone (FLONASE) 50 MCG/ACT nasal spray INSTILL 2 SPRAYS INTO EACH NOSTRIL ONCE DAILY IF NEEDED    . gabapentin (NEURONTIN) 100 MG capsule Take 1 capsule by mouth 2 (two) times daily.    . megestrol (MEGACE) 400 MG/10ML suspension Take 10 mLs (400 mg total) by mouth 2 (two) times daily. 240 mL 0  . OXYCONTIN 10 MG 12 hr tablet Take 1 tablet by mouth 2 (two) times daily.    . prochlorperazine (COMPAZINE) 10 MG tablet Take 1 tablet (10 mg total) by mouth every 6 (six) hours as needed for nausea or vomiting. 30 tablet 0  . tobramycin-dexamethasone (TOBRADEX) ophthalmic solution Instill one drop OD QID x 5 days     No current facility-administered medications for this visit.      Allergies: No Known Allergies  Past Medical History, Surgical history, Social history, and Family History remains without any changes at this time on review.    Physical Exam: Blood pressure 121/73, pulse 83, temperature 98.9 F (37.2 C), temperature source Oral, resp. rate 18, height 5\' 7"  (1.702 m), weight 155 lb 6.4 oz (70.5 kg), SpO2 99 %.   ECOG: 1   General appearance: Alert,  awake without any distress. Head: Atraumatic without abnormalities Oropharynx: Without any thrush or ulcers. Eyes: No scleral icterus. Lymph nodes: No lymphadenopathy noted in the cervical, supraclavicular, or axillary nodes Heart:regular rate and rhythm, without any murmurs or gallops.   Lung: Clear to auscultation without any rhonchi, wheezes or dullness to percussion. Abdomin: Soft, nontender without any shifting dullness or ascites. Musculoskeletal: No clubbing or cyanosis. Neurological: No motor  or sensory deficits. Skin: No rashes or lesions.       Lab Results: Lab Results  Component Value Date   WBC 3.5 (L) 05/13/2019   HGB 10.0 (L) 05/13/2019   HCT 29.2 (L) 05/13/2019   MCV 97.7 05/13/2019   PLT 116 (L) 05/13/2019     Chemistry      Component Value Date/Time   NA 140 05/13/2019 1120   K 4.1 05/13/2019 1120   CL 110 05/13/2019 1120   CO2 22 05/13/2019 1120   BUN 35 (H) 05/13/2019 1120   CREATININE 1.39 (H) 05/13/2019 1120      Component Value Date/Time   CALCIUM 9.1 05/13/2019 1120   ALKPHOS 61 05/13/2019 1120   AST 14 (L) 05/13/2019 1120   ALT 11 05/13/2019 1120   BILITOT 0.5 05/13/2019 1120      Impression and Plan:  85 year old man with:   1.    Bladder cancer diagnosed in May 2020.  He was found to have locally advanced disease with T4N0 high-grade urothelial carcinoma on biopsy.   He is receiving definitive therapy with radiation and weekly carboplatin.  He has tolerated therapy reasonably well without any major decline in his health.  Carboplatin had to be held because of thrombocytopenia but his counts has recovered at this time.  The natural course of this disease was reviewed again and management options were discussed.  At this time I recommended proceeding with one last week of carboplatin to coincide with his last week of radiation and that we will repeat imaging studies in 4 weeks for an evaluation.  Systemic therapy may be needed in the form of immunotherapy if he has advanced disease beyond the bladder.  He is agreeable at this time.  2.  Chronic anticoagulation with Eliquis: No issues reported at this time.  3.  Hydronephrosis: His kidney function remains stable with creatinine clearance continues to be around 40 cc/min.  4.    Abdominal pain: Improved since the start of radiation which indicate possible response to therapy.  5.  Anorexia: Improved with Megace and currently has gained more weight with improved appetite.  6.  Goals of  care and prognosis: His disease remains incurable although aggressive therapy is warranted given his wishes and reasonable performance status.  7.  Thrombocytopenia: Platelet count recovered at this time and will receive carboplatin therapy in 1 week.   8.  Follow-up: He will return in 1 week to receive carboplatin chemotherapy and in 4 weeks for evaluation after repeat imaging studies.  25  minutes was spent with the patient face-to-face today.  More than 50% of time was spent on reviewing his disease status, reviewing laboratory data, treatment options and answering questions regarding future plan of care.[name], MD 7/16/202012:05 PM

## 2019-05-13 NOTE — Telephone Encounter (Signed)
Scheduled appt per 7/16 los - gave patient AVS and calender per los.  

## 2019-05-14 ENCOUNTER — Other Ambulatory Visit: Payer: Self-pay

## 2019-05-14 ENCOUNTER — Ambulatory Visit
Admission: RE | Admit: 2019-05-14 | Discharge: 2019-05-14 | Disposition: A | Discharge status: Home / Self Care | Payer: Medicare Other | Source: Ambulatory Visit | Attending: Radiation Oncology | Admitting: Radiation Oncology

## 2019-05-14 DIAGNOSIS — Z510 Encounter for antineoplastic radiation therapy: Secondary | ICD-10-CM | POA: Diagnosis not present

## 2019-05-17 ENCOUNTER — Other Ambulatory Visit: Payer: Self-pay

## 2019-05-17 ENCOUNTER — Ambulatory Visit
Admission: RE | Admit: 2019-05-17 | Discharge: 2019-05-17 | Disposition: A | Discharge status: Home / Self Care | Payer: Medicare Other | Source: Ambulatory Visit | Attending: Radiation Oncology | Admitting: Radiation Oncology

## 2019-05-17 DIAGNOSIS — Z510 Encounter for antineoplastic radiation therapy: Secondary | ICD-10-CM | POA: Diagnosis not present

## 2019-05-18 ENCOUNTER — Ambulatory Visit
Admission: RE | Admit: 2019-05-18 | Discharge: 2019-05-18 | Disposition: A | Discharge status: Home / Self Care | Payer: Medicare Other | Source: Ambulatory Visit | Attending: Radiation Oncology | Admitting: Radiation Oncology

## 2019-05-18 ENCOUNTER — Other Ambulatory Visit: Payer: Self-pay

## 2019-05-18 DIAGNOSIS — Z510 Encounter for antineoplastic radiation therapy: Secondary | ICD-10-CM | POA: Diagnosis not present

## 2019-05-19 ENCOUNTER — Other Ambulatory Visit: Payer: Self-pay

## 2019-05-19 ENCOUNTER — Ambulatory Visit
Admission: RE | Admit: 2019-05-19 | Discharge: 2019-05-19 | Disposition: A | Discharge status: Home / Self Care | Payer: Medicare Other | Source: Ambulatory Visit | Attending: Radiation Oncology | Admitting: Radiation Oncology

## 2019-05-19 DIAGNOSIS — Z510 Encounter for antineoplastic radiation therapy: Secondary | ICD-10-CM | POA: Diagnosis not present

## 2019-05-20 ENCOUNTER — Inpatient Hospital Stay: Payer: Medicare Other

## 2019-05-20 ENCOUNTER — Ambulatory Visit
Admission: RE | Admit: 2019-05-20 | Discharge: 2019-05-20 | Disposition: A | Discharge status: Home / Self Care | Payer: Medicare Other | Source: Ambulatory Visit | Attending: Radiation Oncology | Admitting: Radiation Oncology

## 2019-05-20 ENCOUNTER — Other Ambulatory Visit: Payer: Self-pay

## 2019-05-20 VITALS — BP 127/80 | HR 79 | Temp 97.7°F | Resp 19 | Ht 67.0 in | Wt 155.5 lb

## 2019-05-20 DIAGNOSIS — C678 Malignant neoplasm of overlapping sites of bladder: Secondary | ICD-10-CM | POA: Diagnosis not present

## 2019-05-20 DIAGNOSIS — Z510 Encounter for antineoplastic radiation therapy: Secondary | ICD-10-CM | POA: Diagnosis not present

## 2019-05-20 DIAGNOSIS — C672 Malignant neoplasm of lateral wall of bladder: Secondary | ICD-10-CM

## 2019-05-20 LAB — CBC WITH DIFFERENTIAL (CANCER CENTER ONLY)
Abs Immature Granulocytes: 0.03 10*3/uL (ref 0.00–0.07)
Basophils Absolute: 0 10*3/uL (ref 0.0–0.1)
Basophils Relative: 0 %
Eosinophils Absolute: 0.1 10*3/uL (ref 0.0–0.5)
Eosinophils Relative: 1 %
HCT: 30.7 % — ABNORMAL LOW (ref 39.0–52.0)
Hemoglobin: 10.7 g/dL — ABNORMAL LOW (ref 13.0–17.0)
Immature Granulocytes: 1 %
Lymphocytes Relative: 12 %
Lymphs Abs: 0.5 10*3/uL — ABNORMAL LOW (ref 0.7–4.0)
MCH: 34.3 pg — ABNORMAL HIGH (ref 26.0–34.0)
MCHC: 34.9 g/dL (ref 30.0–36.0)
MCV: 98.4 fL (ref 80.0–100.0)
Monocytes Absolute: 0.5 10*3/uL (ref 0.1–1.0)
Monocytes Relative: 13 %
Neutro Abs: 3.1 10*3/uL (ref 1.7–7.7)
Neutrophils Relative %: 73 %
Platelet Count: 156 10*3/uL (ref 150–400)
RBC: 3.12 MIL/uL — ABNORMAL LOW (ref 4.22–5.81)
RDW: 17 % — ABNORMAL HIGH (ref 11.5–15.5)
WBC Count: 4.3 10*3/uL (ref 4.0–10.5)
nRBC: 0 % (ref 0.0–0.2)

## 2019-05-20 LAB — CMP (CANCER CENTER ONLY)
ALT: 12 U/L (ref 0–44)
AST: 16 U/L (ref 15–41)
Albumin: 4.1 g/dL (ref 3.5–5.0)
Alkaline Phosphatase: 59 U/L (ref 38–126)
Anion gap: 10 (ref 5–15)
BUN: 37 mg/dL — ABNORMAL HIGH (ref 8–23)
CO2: 20 mmol/L — ABNORMAL LOW (ref 22–32)
Calcium: 9 mg/dL (ref 8.9–10.3)
Chloride: 110 mmol/L (ref 98–111)
Creatinine: 1.58 mg/dL — ABNORMAL HIGH (ref 0.61–1.24)
GFR, Est AFR Am: 43 mL/min — ABNORMAL LOW (ref >60)
GFR, Estimated: 37 mL/min — ABNORMAL LOW (ref >60)
Glucose, Bld: 94 mg/dL (ref 70–99)
Potassium: 4.4 mmol/L (ref 3.5–5.1)
Sodium: 140 mmol/L (ref 135–145)
Total Bilirubin: 0.4 mg/dL (ref 0.3–1.2)
Total Protein: 6.8 g/dL (ref 6.5–8.1)

## 2019-05-20 MED ORDER — PALONOSETRON HCL INJECTION 0.25 MG/5ML
INTRAVENOUS | Status: AC
  Filled 2019-05-20: qty 5

## 2019-05-20 MED ORDER — SODIUM CHLORIDE 0.9 % IV SOLN
54.8000 mg | Freq: Once | INTRAVENOUS | Status: AC
  Administered 2019-05-20: 50 mg via INTRAVENOUS
  Filled 2019-05-20: qty 5

## 2019-05-20 MED ORDER — DEXAMETHASONE SODIUM PHOSPHATE 10 MG/ML IJ SOLN
10.0000 mg | Freq: Once | INTRAMUSCULAR | Status: AC
  Administered 2019-05-20: 15:00:00 10 mg via INTRAVENOUS

## 2019-05-20 MED ORDER — PALONOSETRON HCL INJECTION 0.25 MG/5ML
0.2500 mg | Freq: Once | INTRAVENOUS | Status: AC
  Administered 2019-05-20: 0.25 mg via INTRAVENOUS

## 2019-05-20 MED ORDER — DEXAMETHASONE SODIUM PHOSPHATE 10 MG/ML IJ SOLN
INTRAMUSCULAR | Status: AC
  Filled 2019-05-20: qty 1

## 2019-05-20 MED ORDER — SODIUM CHLORIDE 0.9 % IV SOLN
Freq: Once | INTRAVENOUS | Status: AC
  Administered 2019-05-20: 15:00:00 via INTRAVENOUS
  Filled 2019-05-20: qty 250

## 2019-05-20 NOTE — Patient Instructions (Signed)
[facility] Discharge Instructions for Patients Receiving Chemotherapy  Today you received the following chemotherapy agents: Carboplatin (Paraplatin)  To help prevent nausea and vomiting after your treatment, we encourage you to take your nausea medication as directed.   If you develop nausea and vomiting that is not controlled by your nausea medication, call the clinic.   BELOW ARE SYMPTOMS THAT SHOULD BE REPORTED IMMEDIATELY:  *FEVER GREATER THAN 100.5 F  *CHILLS WITH OR WITHOUT FEVER  NAUSEA AND VOMITING THAT IS NOT CONTROLLED WITH YOUR NAUSEA MEDICATION  *UNUSUAL SHORTNESS OF BREATH  *UNUSUAL BRUISING OR BLEEDING  TENDERNESS IN MOUTH AND THROAT WITH OR WITHOUT PRESENCE OF ULCERS  *URINARY PROBLEMS  *BOWEL PROBLEMS  UNUSUAL RASH Items with * indicate a potential emergency and should be followed up as soon as possible.  Feel free to call the clinic should you have any questions or concerns. The clinic phone number is [phone].  Please show the [facility] at check-in to the Emergency Department and triage nurse.  Coronavirus (COVID-19) Are you at risk?  Are you at risk for the Coronavirus (COVID-19)?  To be considered HIGH RISK for Coronavirus (COVID-19), you have to meet the following criteria:  . Traveled to [place], [place], [place], [place] or [place]; or in the [place] to [place], [place], [place], or [place]; and have fever, cough, and shortness of breath within the last 2 weeks of travel OR . Been in close contact with a person diagnosed with COVID-19 within the last 2 weeks and have fever, cough, and shortness of breath . IF YOU DO NOT MEET THESE CRITERIA, YOU ARE CONSIDERED LOW RISK FOR COVID-19.  What to do if you are HIGH RISK for COVID-19?  Marland Kitchen If you are having a medical emergency, call 911. . Seek medical care right away. Before you go to a doctor's office, urgent care or emergency department, call ahead and tell them  about your recent travel, contact with someone diagnosed with COVID-19, and your symptoms. You should receive instructions from your physician's office regarding next steps of care.  . When you arrive at healthcare provider, tell the healthcare staff immediately you have returned from visiting [place], [place], [place], [place] or [place]; or traveled in the [place] to [place], [place], [place], or [place]; in the last two weeks or you have been in close contact with a person diagnosed with COVID-19 in the last 2 weeks.   . Tell the health care staff about your symptoms: fever, cough and shortness of breath. . After you have been seen by a medical provider, you will be either: o Tested for (COVID-19) and discharged home on quarantine except to seek medical care if symptoms worsen, and asked to  - Stay home and avoid contact with others until you get your results (4-5 days)  - Avoid travel on public transportation if possible (such as bus, train, or airplane) or o Sent to the Emergency Department by EMS for evaluation, COVID-19 testing, and possible admission depending on your condition and test results.  What to do if you are LOW RISK for COVID-19?  Reduce your risk of any infection by using the same precautions used for avoiding the common cold or flu:  Marland Kitchen Wash your hands often with soap and warm water for at least 20 seconds.  If soap and water are not readily available, use an alcohol-based hand sanitizer with at least 60% alcohol.  . If coughing or  sneezing, cover your mouth and nose by coughing or sneezing into the elbow areas of your shirt or coat, into a tissue or into your sleeve (not your hands). . Avoid shaking hands with others and consider head nods or verbal greetings only. . Avoid touching your eyes, nose, or mouth with unwashed hands.  . Avoid close contact with people who are sick. . Avoid places or events with large numbers of people in one location, like concerts or  sporting events. . Carefully consider travel plans you have or are making. . If you are planning any travel outside or inside the [place], visit the CDC's Travelers' Health webpage for the latest health notices. . If you have some symptoms but not all symptoms, continue to monitor at home and seek medical attention if your symptoms worsen. . If you are having a medical emergency, call 911.   [place] / e-Visit: [email or web address]         [facility]: [facility]: [phone]                   [facility]: [phone]

## 2019-05-20 NOTE — Progress Notes (Signed)
Per Dr. [name] it is ok to treat with creatinine 1.58

## 2019-05-21 ENCOUNTER — Encounter: Payer: Self-pay | Admitting: Radiation Oncology

## 2019-05-21 ENCOUNTER — Other Ambulatory Visit: Payer: Self-pay

## 2019-05-21 ENCOUNTER — Ambulatory Visit
Admission: RE | Admit: 2019-05-21 | Discharge: 2019-05-21 | Disposition: A | Discharge status: Home / Self Care | Payer: Medicare Other | Source: Ambulatory Visit | Attending: Radiation Oncology | Admitting: Radiation Oncology

## 2019-05-21 DIAGNOSIS — Z510 Encounter for antineoplastic radiation therapy: Secondary | ICD-10-CM | POA: Diagnosis not present

## 2019-05-28 ENCOUNTER — Telehealth: Payer: Self-pay

## 2019-05-28 NOTE — Telephone Encounter (Signed)
Received a call from the patient stating that he does not feel that he can make it to his 11:30 lab appt because he will not be finished with the PET scan. Contacted patient and made him aware that the lab appt has been changed to 9:15, prior to the 10:00 am PET scan. Patient stated that he was aware and appreciated the call. He is also aware of his follow up MD appt. No other questions or concerns.

## 2019-06-11 ENCOUNTER — Other Ambulatory Visit: Payer: Medicare Other

## 2019-06-11 ENCOUNTER — Inpatient Hospital Stay: Payer: Medicare Other | Attending: Oncology

## 2019-06-11 ENCOUNTER — Other Ambulatory Visit: Payer: Self-pay

## 2019-06-11 ENCOUNTER — Ambulatory Visit (HOSPITAL_COMMUNITY)
Admission: RE | Admit: 2019-06-11 | Discharge: 2019-06-11 | Disposition: A | Discharge status: Home / Self Care | Payer: Medicare Other | Source: Ambulatory Visit | Attending: Oncology | Admitting: Oncology

## 2019-06-11 ENCOUNTER — Telehealth: Payer: Self-pay | Admitting: *Deleted

## 2019-06-11 DIAGNOSIS — Z9221 Personal history of antineoplastic chemotherapy: Secondary | ICD-10-CM | POA: Insufficient documentation

## 2019-06-11 DIAGNOSIS — C678 Malignant neoplasm of overlapping sites of bladder: Secondary | ICD-10-CM | POA: Diagnosis present

## 2019-06-11 DIAGNOSIS — R5383 Other fatigue: Secondary | ICD-10-CM | POA: Diagnosis not present

## 2019-06-11 DIAGNOSIS — R600 Localized edema: Secondary | ICD-10-CM | POA: Insufficient documentation

## 2019-06-11 DIAGNOSIS — C672 Malignant neoplasm of lateral wall of bladder: Secondary | ICD-10-CM | POA: Diagnosis present

## 2019-06-11 DIAGNOSIS — Z79899 Other long term (current) drug therapy: Secondary | ICD-10-CM | POA: Diagnosis not present

## 2019-06-11 DIAGNOSIS — Z923 Personal history of irradiation: Secondary | ICD-10-CM | POA: Insufficient documentation

## 2019-06-11 DIAGNOSIS — Z7982 Long term (current) use of aspirin: Secondary | ICD-10-CM | POA: Insufficient documentation

## 2019-06-11 DIAGNOSIS — R630 Anorexia: Secondary | ICD-10-CM | POA: Diagnosis not present

## 2019-06-11 LAB — CMP (CANCER CENTER ONLY)
ALT: 18 U/L (ref 0–44)
AST: 21 U/L (ref 15–41)
Albumin: 3.9 g/dL (ref 3.5–5.0)
Alkaline Phosphatase: 62 U/L (ref 38–126)
Anion gap: 11 (ref 5–15)
BUN: 28 mg/dL — ABNORMAL HIGH (ref 8–23)
CO2: 21 mmol/L — ABNORMAL LOW (ref 22–32)
Calcium: 9.3 mg/dL (ref 8.9–10.3)
Chloride: 111 mmol/L (ref 98–111)
Creatinine: 1.29 mg/dL — ABNORMAL HIGH (ref 0.61–1.24)
GFR, Est AFR Am: 55 mL/min — ABNORMAL LOW (ref >60)
GFR, Estimated: 47 mL/min — ABNORMAL LOW (ref >60)
Glucose, Bld: 102 mg/dL — ABNORMAL HIGH (ref 70–99)
Potassium: 4.3 mmol/L (ref 3.5–5.1)
Sodium: 143 mmol/L (ref 135–145)
Total Bilirubin: 0.8 mg/dL (ref 0.3–1.2)
Total Protein: 6.8 g/dL (ref 6.5–8.1)

## 2019-06-11 LAB — CBC WITH DIFFERENTIAL (CANCER CENTER ONLY)
Abs Immature Granulocytes: 0.03 10*3/uL (ref 0.00–0.07)
Basophils Absolute: 0 10*3/uL (ref 0.0–0.1)
Basophils Relative: 1 %
Eosinophils Absolute: 0.1 10*3/uL (ref 0.0–0.5)
Eosinophils Relative: 2 %
HCT: 29.9 % — ABNORMAL LOW (ref 39.0–52.0)
Hemoglobin: 10.3 g/dL — ABNORMAL LOW (ref 13.0–17.0)
Immature Granulocytes: 1 %
Lymphocytes Relative: 14 %
Lymphs Abs: 0.5 10*3/uL — ABNORMAL LOW (ref 0.7–4.0)
MCH: 35.4 pg — ABNORMAL HIGH (ref 26.0–34.0)
MCHC: 34.4 g/dL (ref 30.0–36.0)
MCV: 102.7 fL — ABNORMAL HIGH (ref 80.0–100.0)
Monocytes Absolute: 0.4 10*3/uL (ref 0.1–1.0)
Monocytes Relative: 10 %
Neutro Abs: 2.8 10*3/uL (ref 1.7–7.7)
Neutrophils Relative %: 72 %
Platelet Count: 146 10*3/uL — ABNORMAL LOW (ref 150–400)
RBC: 2.91 MIL/uL — ABNORMAL LOW (ref 4.22–5.81)
RDW: 15.8 % — ABNORMAL HIGH (ref 11.5–15.5)
WBC Count: 3.9 10*3/uL — ABNORMAL LOW (ref 4.0–10.5)
nRBC: 0 % (ref 0.0–0.2)

## 2019-06-11 LAB — GLUCOSE, CAPILLARY: Glucose-Capillary: 98 mg/dL (ref 70–99)

## 2019-06-11 MED ORDER — FLUDEOXYGLUCOSE F - 18 (FDG) INJECTION
7.7700 | Freq: Once | INTRAVENOUS | Status: AC | PRN
  Administered 2019-06-11: 7.77 via INTRAVENOUS

## 2019-06-11 NOTE — Telephone Encounter (Signed)
Per [facility] "stable post scan".  Spoke with patient who reports "feeling fine currently going home".  Advised to call if needed.  Report to ED if respiratory distress.  Aware of F/U Monday.

## 2019-06-11 NOTE — Telephone Encounter (Signed)
"  [place], [place] [phone]).  Notify Dr. Melanee Spry Buffa is here now and will be with [place] until 11:30 am.    Pretty short of breath, breathing hard transferring from wheelchair to our chair.  Little movement with breathing.  Now that he's seated breathing is settling down.      Says he feels tired, fatigued worse than with chemotherapy and radiation.    Heart rate elevated; doesn't look well.    We checked his blood sugar which is normal.  Says Eliquis was stopped with chemotherapy when asked if history of blood clots.  B/P = 138/81, HR = 95, O2 sat = 100%. He can tolerate todays PET Scan. "

## 2019-06-11 NOTE — Telephone Encounter (Signed)
Noted. If he is stable after PET, he can go home. If not, they can send him to ED.

## 2019-06-14 ENCOUNTER — Inpatient Hospital Stay (HOSPITAL_BASED_OUTPATIENT_CLINIC_OR_DEPARTMENT_OTHER): Payer: Medicare Other | Admitting: Oncology

## 2019-06-14 ENCOUNTER — Other Ambulatory Visit: Payer: Self-pay

## 2019-06-14 VITALS — BP 119/72 | HR 84 | Temp 98.0°F | Resp 20 | Wt 158.6 lb

## 2019-06-14 DIAGNOSIS — C672 Malignant neoplasm of lateral wall of bladder: Secondary | ICD-10-CM

## 2019-06-14 DIAGNOSIS — C678 Malignant neoplasm of overlapping sites of bladder: Secondary | ICD-10-CM | POA: Diagnosis not present

## 2019-06-14 NOTE — Progress Notes (Signed)
Hematology and Oncology Follow Up Visit  Terry Sullivan [id number] 1924-04-26 85 y.o. 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    Prior Therapy:  He is status post percutaneous biopsy of his bladder mass obtained on Mar 18, 2019 which confirmed the presence of urothelial carcinoma.  Definitive therapy with radiation as well as weekly carboplatin started on 04/01/2019.  He completed therapy on May 20, 2019.   Current therapy: Active surveillance  Interim History: [name] presents today for a repeat evaluation.  Since last visit, he completed radiation therapy concomitantly with carboplatin without any major residual complications.  He does report some fatigue and some tiredness associated with it.  He did report some mild lower extremity edema and occasional frequency.  He denies any worsening abdominal pain, flank pain or hematuria.  He denies any fevers or chills.  His performance this remains reasonable at this time.  He is eating better and has gained weight.  Patient denied any alteration mental status, neuropathy, confusion or dizziness.  Denies any headaches or lethargy.  Denies any night sweats, weight loss or changes in appetite.  Denied orthopnea, dyspnea on exertion or chest discomfort.  Denies shortness of breath, difficulty breathing hemoptysis or cough.  Denies any abdominal distention, nausea, early satiety or dyspepsia.  Denies any hematuria, frequency, dysuria or nocturia.  Denies any skin irritation, dryness or rash.  Denies any ecchymosis or petechiae.  Denies any lymphadenopathy or clotting.  Denies any heat or cold intolerance.  Denies any anxiety or depression.  Remaining review of system is negative.             Medications: Without any changes on review. Current Outpatient Medications  Medication Sig Dispense Refill  .  acetaminophen (TYLENOL) 500 MG tablet Take 500 mg by mouth 2 (two) times daily.    Marland Kitchen aspirin EC 81 MG tablet Take 1 tablet (81 mg total) by mouth daily. 90 tablet 3  . cetirizine (ZYRTEC) 10 MG tablet Take by mouth as needed.    . doxazosin (CARDURA) 8 MG tablet Take 4 mg by mouth daily.    . fluticasone (FLONASE) 50 MCG/ACT nasal spray INSTILL 2 SPRAYS INTO EACH NOSTRIL ONCE DAILY IF NEEDED    . gabapentin (NEURONTIN) 100 MG capsule Take 1 capsule by mouth 2 (two) times daily.    . megestrol (MEGACE) 400 MG/10ML suspension Take 10 mLs (400 mg total) by mouth 2 (two) times daily. 240 mL 0  . OXYCONTIN 10 MG 12 hr tablet Take 1 tablet by mouth 2 (two) times daily.    . prochlorperazine (COMPAZINE) 10 MG tablet Take 1 tablet (10 mg total) by mouth every 6 (six) hours as needed for nausea or vomiting. 30 tablet 0  . tobramycin-dexamethasone (TOBRADEX) ophthalmic solution Instill one drop OD QID x 5 days     No current facility-administered medications for this visit.      Allergies: No Known Allergies  Past Medical History, Surgical history, Social history, and Family History updated today without any change.    Physical Exam:  Blood pressure 119/72, pulse 84, temperature 98 F (36.7 C), temperature source Oral, resp. rate 20, weight 158 lb 9 oz (71.9 kg), SpO2 100 %.    ECOG: 1   General appearance: Comfortable appearing without any discomfort Head: Normocephalic without any trauma Oropharynx: Mucous membranes are moist and pink without any thrush or ulcers. Eyes:  Pupils are equal and round reactive to light. Lymph nodes: No cervical, supraclavicular, inguinal or axillary lymphadenopathy.   Heart:regular rate and rhythm.  S1 and S2.  Trace ankle edema noted. Lung: Clear without any rhonchi or wheezes.  No dullness to percussion. Abdomin: Soft, nontender, nondistended with good bowel sounds.  No hepatosplenomegaly. Musculoskeletal: No joint deformity or effusion.  Full range of  motion noted. Neurological: No deficits noted on motor, sensory and deep tendon reflex exam. Skin: No petechial rash or dryness.  Appeared moist.         Lab Results: Lab Results  Component Value Date   WBC 3.9 (L) 06/11/2019   HGB 10.3 (L) 06/11/2019   HCT 29.9 (L) 06/11/2019   MCV 102.7 (H) 06/11/2019   PLT 146 (L) 06/11/2019     Chemistry      Component Value Date/Time   NA 143 06/11/2019 0926   K 4.3 06/11/2019 0926   CL 111 06/11/2019 0926   CO2 21 (L) 06/11/2019 0926   BUN 28 (H) 06/11/2019 0926   CREATININE 1.29 (H) 06/11/2019 0926      Component Value Date/Time   CALCIUM 9.3 06/11/2019 0926   ALKPHOS 62 06/11/2019 0926   AST 21 06/11/2019 0926   ALT 18 06/11/2019 0926   BILITOT 0.8 06/11/2019 0926     EXAM: NUCLEAR MEDICINE PET SKULL BASE TO THIGH  TECHNIQUE: 7.8 mCi F-18 FDG was injected intravenously. Full-ring PET imaging was performed from the skull base to thigh after the radiotracer. CT data was obtained and used for attenuation correction and anatomic localization.  Fasting blood glucose: 98 mg/dl  COMPARISON:  PET-CT 04/19/2017  FINDINGS: Mediastinal blood pool activity: SUV max 2.3  Liver activity: SUV max NA  NECK: No hypermetabolic lymph nodes in the neck.  Incidental CT findings: none  CHEST: No hypermetabolic mediastinal or hilar nodes. No suspicious pulmonary nodules on the CT scan.  Incidental CT findings: none  ABDOMEN/PELVIS: The bladder is not well evaluated by PET imaging as there is intense radiotracer activity within the bladder urine. No abnormality on CT portion other than small diverticulum along the RIGHT lateral margin.  No evidence of metastatic disease the abdomen pelvis. Kidneys adrenal glands appear normal. No obstruction.  No hypermetabolic adenopathy in the abdomen pelvis.  Incidental CT findings: Atherosclerotic calcification of the aorta.  SKELETON: There is metabolic activity associated  with inflammatory soft tissue thickening inferior to the LEFT ischium. No evidence of metastatic bone disease. Extensive degenerative changes in the lumbar spine.  Incidental CT findings: none  IMPRESSION: 1. No evidence of bladder cancer metastasis in the chest, abdomen, or pelvis. 2. No evidence skeletal metastasis. 3. Bladder is poorly evaluated. No mass on non contrast CT portion of exam.   Impression and Plan:  85 year old man with:   1.    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  He completed definitive therapy outlined above but any major complications.  Imaging studies obtained on 06/11/2019 was personally reviewed and showed no evidence of metastatic disease at this time with resolution of his bladder mass based on imaging studies without any evidence of metastatic disease.  The natural course of this disease and risk of relapse was discussed.  I recommended continued active surveillance with repeat imaging studies in 3 months specifically contrasted images of the abdomen and pelvis for further evaluation.  If he develops metastatic disease consideration for immunotherapy would be the next step.  He has urology follow-up in  the immediate future for possible repeat cystoscopy.   2.  Hydronephrosis: His kidney function remains adequate with creatinine clearance to be stable and slightly improving.  No evidence of hydronephrosis noted on his imaging studies.  3.  Lower extremity swelling: Appears to be mild at the ankle level.  I recommended feet elevation at this time.  4.    Abdominal pain: Resolved at this time after completing radiation therapy.  5.  Anorexia: He has gained weight with the help of Megace.  6.  Goals of care and prognosis: He has experienced excellent response to therapy although it is very possible he may developed relapsed disease.  At that time I do not believe his disease is curable.  7.  Thrombocytopenia: Resolved  at this time with platelet count back to normal.   8.  Follow-up: Will be in 3 months for repeat evaluation.  25  minutes was spent with the patient face-to-face today.  More than 50% of time was dedicated to reviewing imaging studies, disease status update and treatment options for the future.   [name], MD 8/17/20209:08 AM

## 2019-06-16 ENCOUNTER — Telehealth: Payer: Self-pay | Admitting: Oncology

## 2019-06-16 NOTE — Telephone Encounter (Signed)
Called and spoke with patient. Confirmed dec appts.  

## 2019-06-20 NOTE — Progress Notes (Signed)
  Radiation Oncology         [phone]) [phone] ________________________________  Name: Terry Sullivan MRN: [id number]  Date: 05/21/2019  DOB: 1924-02-03  End of Treatment Note  Diagnosis:   85 y.o. male with locally advanced, high-grade muscle invasive urothelial carcinoma of the bladder     Indication for treatment:  Curative       Radiation treatment dates:   6/4-7/24/20  Site/dose:    1. The pelvic nodes and bladder were treated to 45 Gy in 25 fractions 2. The bladder tumor was boosted to 64.8 Gy in 11 more fractions  Beams/energy:    1. The pelvic nodes and bladder were treated using a 4-field 3D technique 2. The bladder tumor was boosted using a 3D technique  Narrative: The patient tolerated radiation treatment relatively well.   He had minor bladder and bowel irritation.  Plan: The patient has completed radiation treatment. The patient will return to radiation oncology clinic for routine followup in one month. I advised him to call or return sooner if he has any questions or concerns related to his recovery or treatment. ________________________________  [name]. [name], M.D.

## 2019-06-24 ENCOUNTER — Encounter: Payer: Self-pay | Admitting: Urology

## 2019-06-24 ENCOUNTER — Other Ambulatory Visit: Payer: Self-pay

## 2019-06-24 ENCOUNTER — Ambulatory Visit
Admission: RE | Admit: 2019-06-24 | Discharge: 2019-06-24 | Disposition: A | Discharge status: Home / Self Care | Payer: Medicare Other | Source: Ambulatory Visit | Attending: Urology | Admitting: Urology

## 2019-06-24 DIAGNOSIS — C672 Malignant neoplasm of lateral wall of bladder: Secondary | ICD-10-CM

## 2019-06-24 DIAGNOSIS — C673 Malignant neoplasm of anterior wall of bladder: Secondary | ICD-10-CM

## 2019-06-24 NOTE — Progress Notes (Signed)
Radiation Oncology         [phone] ________________________________  Name: Terry Sullivan MRN: [id number]  Date: 06/24/2019  DOB: [birth year]  Post Treatment Note  CC: Terry Orn, MD  Terry Orn, MD  Diagnosis:   85 y.o. male with locally advanced, high-grade muscle invasive urothelial carcinoma of the bladder    Interval Since Last Radiation:  5 weeks  6/4-7/24/20:   1. The pelvic nodes and bladder were treated to 45 Gy in 25 fractions 2. The bladder tumor was boosted to 64.8 Gy in 11 more fractions   Narrative:  I spoke with the patient to conduct his routine scheduled 1 month follow up visit via telephone to spare the patient unnecessary potential exposure in the healthcare setting during the current COVID-19 pandemic.  The patient was notified in advance and gave permission to proceed with this visit format.   He tolerated his concurrent chemoradiation treatment relatively well with only modest fatigue and minor bladder and bowel irritation.                              On review of systems, the patient states that he is doing well overall.  He continues with modest generalized fatigue and weakness but feels that he is gradually regaining his strength.  He reports a healthy appetite and is gaining some weight.  He specifically denies gross hematuria, dysuria, excessive daytime frequency, urgency, incontinence or incomplete bladder emptying.  He denies abdominal pain, nausea, vomiting, diarrhea or constipation.  He had a recent follow-up PET scan on 06/11/2019 to assess treatment response and this showed no evidence of metastatic disease and resolution of his bladder mass which he is quite pleased with.   ALLERGIES:  has No Known Allergies.  Meds: Current Outpatient Medications  Medication Sig Dispense Refill  . acetaminophen (TYLENOL) 500 MG tablet Take 500 mg by mouth 2 (two) times daily.    Marland Kitchen aspirin EC 81 MG tablet Take 1 tablet (81 mg total) by mouth daily. 90 tablet 3   . cetirizine (ZYRTEC) 10 MG tablet Take by mouth as needed.    . doxazosin (CARDURA) 8 MG tablet Take 4 mg by mouth daily.    . fluticasone (FLONASE) 50 MCG/ACT nasal spray INSTILL 2 SPRAYS INTO EACH NOSTRIL ONCE DAILY IF NEEDED    . gabapentin (NEURONTIN) 100 MG capsule Take 1 capsule by mouth 2 (two) times daily.    . mirabegron ER (MYRBETRIQ) 25 MG TB24 tablet Take 25 mg by mouth daily.    . OXYCONTIN 10 MG 12 hr tablet Take 1 tablet by mouth 2 (two) times daily.    . megestrol (MEGACE) 400 MG/10ML suspension Take 10 mLs (400 mg total) by mouth 2 (two) times daily. (Patient not taking: Reported on 06/24/2019) 240 mL 0  . prochlorperazine (COMPAZINE) 10 MG tablet Take 1 tablet (10 mg total) by mouth every 6 (six) hours as needed for nausea or vomiting. (Patient not taking: Reported on 06/24/2019) 30 tablet 0  . tobramycin-dexamethasone (TOBRADEX) ophthalmic solution Instill one drop OD QID x 5 days     No current facility-administered medications for this encounter.     Physical Findings:  vitals were not taken for this visit.   /Unable to assess due to telephone follow-up visit format.  Lab Findings: Lab Results  Component Value Date   WBC 3.9 (L) 06/11/2019   HGB 10.3 (L) 06/11/2019   HCT 29.9 (L) 06/11/2019  MCV 102.7 (H) 06/11/2019   PLT 146 (L) 06/11/2019     Radiographic Findings: Nm Pet Image Restag (ps) Skull Base To Thigh  Result Date: 06/11/2019 CLINICAL DATA:  Subsequent treatment strategy for invasive bladder cancer. EXAM: NUCLEAR MEDICINE PET SKULL BASE TO THIGH TECHNIQUE: 7.8 mCi F-18 FDG was injected intravenously. Full-ring PET imaging was performed from the skull base to thigh after the radiotracer. CT data was obtained and used for attenuation correction and anatomic localization. Fasting blood glucose: 98 mg/dl COMPARISON:  PET-CT 04/19/2017 FINDINGS: Mediastinal blood pool activity: SUV max 2.3 Liver activity: SUV max NA NECK: No hypermetabolic lymph nodes in the  neck. Incidental CT findings: none CHEST: No hypermetabolic mediastinal or hilar nodes. No suspicious pulmonary nodules on the CT scan. Incidental CT findings: none ABDOMEN/PELVIS: The bladder is not well evaluated by PET imaging as there is intense radiotracer activity within the bladder urine. No abnormality on CT portion other than small diverticulum along the RIGHT lateral margin. No evidence of metastatic disease the abdomen pelvis. Kidneys adrenal glands appear normal. No obstruction. No hypermetabolic adenopathy in the abdomen pelvis. Incidental CT findings: Atherosclerotic calcification of the aorta. SKELETON: There is metabolic activity associated with inflammatory soft tissue thickening inferior to the LEFT ischium. No evidence of metastatic bone disease. Extensive degenerative changes in the lumbar spine. Incidental CT findings: none IMPRESSION: 1. No evidence of bladder cancer metastasis in the chest, abdomen, or pelvis. 2. No evidence skeletal metastasis. 3. Bladder is poorly evaluated. No mass on non contrast CT portion of exam. Electronically Signed   By: Suzy Bouchard M.D.   On: 06/11/2019 13:00    Impression/Plan: 1. 85 y.o. male with locally advanced, high-grade muscle invasive urothelial carcinoma of the bladder. He appears to have recovered well from the effects of his recent concurrent chemoradiation to the bladder.  He is currently without complaints and reports that he is regaining his strength and feels that his fatigue is gradually improving as well.  Overall, he is quite pleased with his progress to date.  He had a recent follow-up visit with Dr. [name] on 06/14/2019 where they reviewed his recent PET imaging which shows resolution of the bladder mass and no evidence of metastatic disease and also had a follow-up visit with Dr. [name] on Monday, 06/21/2019 where he received a good report as well.  We discussed that while we are happy to continue to participate in his care if  clinically indicated, at this point, we will plan to see him back on an as-needed basis.  The current plan is to proceed in active surveillance with a follow-up with Dr. [name] in December 2020 with repeat systemic imaging prior to that visit and a follow-up with Dr. [name] approximately 3 months thereafter for cystoscopy in the office.  He appears to have a good understanding of his disease and our recommendations and is comfortable and in agreement with the stated plan.  He knows that he is welcome to call at anytime with any questions or concerns related to his previous radiotherapy.    Nicholos Johns, PA-C

## 2019-07-29 ENCOUNTER — Ambulatory Visit (HOSPITAL_COMMUNITY)
Admission: RE | Admit: 2019-07-29 | Discharge: 2019-07-29 | Disposition: A | Discharge status: Home / Self Care | Payer: Medicare Other | Source: Ambulatory Visit | Attending: Family | Admitting: Family

## 2019-07-29 ENCOUNTER — Other Ambulatory Visit: Payer: Self-pay

## 2019-07-29 ENCOUNTER — Other Ambulatory Visit (HOSPITAL_COMMUNITY): Payer: Self-pay | Admitting: Internal Medicine

## 2019-07-29 ENCOUNTER — Other Ambulatory Visit: Payer: Self-pay | Admitting: Internal Medicine

## 2019-07-29 DIAGNOSIS — R609 Edema, unspecified: Secondary | ICD-10-CM

## 2019-07-29 DIAGNOSIS — M79604 Pain in right leg: Secondary | ICD-10-CM

## 2019-08-02 ENCOUNTER — Encounter: Payer: Self-pay | Admitting: Oncology

## 2019-08-09 ENCOUNTER — Encounter: Payer: Self-pay | Admitting: Oncology

## 2019-08-10 ENCOUNTER — Telehealth: Payer: Self-pay

## 2019-08-10 NOTE — Telephone Encounter (Signed)
Unable to leave message for pt his VM not set up. Called to r/s 10/04/19 appt due to CV no longer being here

## 2019-08-17 ENCOUNTER — Encounter: Payer: Self-pay | Admitting: Oncology

## 2019-08-18 ENCOUNTER — Other Ambulatory Visit: Payer: Self-pay

## 2019-08-18 MED ORDER — MEGESTROL ACETATE 400 MG/10ML PO SUSP: 400.0000 mg | Freq: Two times a day (BID) | ORAL | 0 refills | Status: AC

## 2019-09-03 ENCOUNTER — Telehealth: Payer: Self-pay | Admitting: Oncology

## 2019-09-03 NOTE — Telephone Encounter (Signed)
Rescheduled 12/01 lab appointment to 12/02 for CT scan per request.

## 2019-09-27 ENCOUNTER — Telehealth: Payer: Self-pay | Admitting: Oncology

## 2019-09-27 NOTE — Telephone Encounter (Signed)
Called patient regarding providers request, converted 12/08 follow-up visit to a phone visit.

## 2019-09-28 ENCOUNTER — Other Ambulatory Visit: Payer: Medicare Other

## 2019-09-29 ENCOUNTER — Other Ambulatory Visit: Payer: Self-pay

## 2019-09-29 ENCOUNTER — Ambulatory Visit (HOSPITAL_COMMUNITY)
Admission: RE | Admit: 2019-09-29 | Discharge: 2019-09-29 | Disposition: A | Discharge status: Home / Self Care | Payer: Medicare Other | Source: Ambulatory Visit | Attending: Oncology | Admitting: Oncology

## 2019-09-29 ENCOUNTER — Inpatient Hospital Stay: Payer: Medicare Other | Attending: Oncology

## 2019-09-29 ENCOUNTER — Encounter (HOSPITAL_COMMUNITY): Payer: Self-pay

## 2019-09-29 DIAGNOSIS — Z9221 Personal history of antineoplastic chemotherapy: Secondary | ICD-10-CM | POA: Diagnosis not present

## 2019-09-29 DIAGNOSIS — I700 Atherosclerosis of aorta: Secondary | ICD-10-CM | POA: Insufficient documentation

## 2019-09-29 DIAGNOSIS — Z79899 Other long term (current) drug therapy: Secondary | ICD-10-CM | POA: Insufficient documentation

## 2019-09-29 DIAGNOSIS — C672 Malignant neoplasm of lateral wall of bladder: Secondary | ICD-10-CM | POA: Insufficient documentation

## 2019-09-29 DIAGNOSIS — R5383 Other fatigue: Secondary | ICD-10-CM | POA: Diagnosis not present

## 2019-09-29 DIAGNOSIS — Z7982 Long term (current) use of aspirin: Secondary | ICD-10-CM | POA: Insufficient documentation

## 2019-09-29 DIAGNOSIS — Z923 Personal history of irradiation: Secondary | ICD-10-CM | POA: Diagnosis not present

## 2019-09-29 LAB — CMP (CANCER CENTER ONLY)
ALT: 17 U/L (ref 0–44)
AST: 22 U/L (ref 15–41)
Albumin: 4.5 g/dL (ref 3.5–5.0)
Alkaline Phosphatase: 70 U/L (ref 38–126)
Anion gap: 9 (ref 5–15)
BUN: 30 mg/dL — ABNORMAL HIGH (ref 8–23)
CO2: 26 mmol/L (ref 22–32)
Calcium: 9.7 mg/dL (ref 8.9–10.3)
Chloride: 107 mmol/L (ref 98–111)
Creatinine: 1.36 mg/dL — ABNORMAL HIGH (ref 0.61–1.24)
GFR, Est AFR Am: 52 mL/min — ABNORMAL LOW (ref >60)
GFR, Estimated: 45 mL/min — ABNORMAL LOW (ref >60)
Glucose, Bld: 105 mg/dL — ABNORMAL HIGH (ref 70–99)
Potassium: 4.3 mmol/L (ref 3.5–5.1)
Sodium: 142 mmol/L (ref 135–145)
Total Bilirubin: 0.6 mg/dL (ref 0.3–1.2)
Total Protein: 7.7 g/dL (ref 6.5–8.1)

## 2019-09-29 LAB — CBC WITH DIFFERENTIAL (CANCER CENTER ONLY)
Abs Immature Granulocytes: 0.01 10*3/uL (ref 0.00–0.07)
Basophils Absolute: 0 10*3/uL (ref 0.0–0.1)
Basophils Relative: 1 %
Eosinophils Absolute: 0.1 10*3/uL (ref 0.0–0.5)
Eosinophils Relative: 2 %
HCT: 41.4 % (ref 39.0–52.0)
Hemoglobin: 14.1 g/dL (ref 13.0–17.0)
Immature Granulocytes: 0 %
Lymphocytes Relative: 22 %
Lymphs Abs: 1 10*3/uL (ref 0.7–4.0)
MCH: 33 pg (ref 26.0–34.0)
MCHC: 34.1 g/dL (ref 30.0–36.0)
MCV: 97 fL (ref 80.0–100.0)
Monocytes Absolute: 0.4 10*3/uL (ref 0.1–1.0)
Monocytes Relative: 7 %
Neutro Abs: 3.2 10*3/uL (ref 1.7–7.7)
Neutrophils Relative %: 68 %
Platelet Count: 153 10*3/uL (ref 150–400)
RBC: 4.27 MIL/uL (ref 4.22–5.81)
RDW: 12.4 % (ref 11.5–15.5)
WBC Count: 4.7 10*3/uL (ref 4.0–10.5)
nRBC: 0 % (ref 0.0–0.2)

## 2019-09-29 MED ORDER — SODIUM CHLORIDE (PF) 0.9 % IJ SOLN
INTRAMUSCULAR | Status: AC
  Filled 2019-09-29: qty 50

## 2019-09-29 MED ORDER — IOHEXOL 300 MG/ML  SOLN
75.0000 mL | Freq: Once | INTRAMUSCULAR | Status: AC | PRN
  Administered 2019-09-29: 11:00:00 75 mL via INTRAVENOUS

## 2019-09-29 MED ORDER — SODIUM CHLORIDE 0.9 % IV SOLN
INTRAVENOUS | Status: AC
  Filled 2019-09-29: qty 250

## 2019-10-04 ENCOUNTER — Ambulatory Visit: Payer: Medicare Other | Admitting: Cardiology

## 2019-10-05 ENCOUNTER — Inpatient Hospital Stay (HOSPITAL_BASED_OUTPATIENT_CLINIC_OR_DEPARTMENT_OTHER): Payer: Medicare Other | Admitting: Oncology

## 2019-10-05 DIAGNOSIS — C672 Malignant neoplasm of lateral wall of bladder: Secondary | ICD-10-CM

## 2019-10-05 NOTE — Progress Notes (Signed)
Hematology and Oncology Follow Up for Telemedicine Visits  Avedis Callo [id number] [birth year] 85 y.o. 10/05/2019 8:12 AM Terry Sullivan, MDGriffin, [name], MD   I connected with Terry Sullivan on 10/05/19 at  9:00 AM EST by telephone visit and verified that I am speaking with the correct person using two identifiers.   I discussed the limitations, risks, security and privacy concerns of performing an evaluation and management service by telemedicine and the availability of in-person appointments. I also discussed with the patient that there may be a patient responsible charge related to this service. The patient expressed understanding and agreed to proceed.  Other persons participating in the visit and their role in the encounter:    Patient's location: Home Provider's location: Office    Principle Diagnosis: 85 year old man with bladder cancer diagnosed in May 2020.  He was found to have T4N0 high-grade urothelial carcinoma.    Prior Therapy:  He is status post percutaneous biopsy of his bladder mass obtained on Mar 18, 2019 which confirmed the presence of urothelial carcinoma.  Definitive therapy with radiation as well as weekly carboplatin started on 04/01/2019.  He completed therapy on May 20, 2019.   Current therapy: Active surveillance    Interim History: Terry Sullivan reports overall feeling reasonably well without any major complaints.  He still has some degree of fatigue and tiredness but no recent falls or syncope.  He does ambulate with the help of a walker without any recent falls.  He denies any abdominal pain or discomfort.  He denies any hematuria or dysuria.     Medications: I have reviewed the patient's current medications.  Current Outpatient Medications  Medication Sig Dispense Refill  . acetaminophen (TYLENOL) 500 MG tablet Take 500 mg by mouth 2 (two) times daily.    Marland Kitchen aspirin EC 81 MG tablet Take 1 tablet (81 mg total) by mouth daily. 90 tablet 3  .  cetirizine (ZYRTEC) 10 MG tablet Take by mouth as needed.    . doxazosin (CARDURA) 8 MG tablet Take 4 mg by mouth daily.    . fluticasone (FLONASE) 50 MCG/ACT nasal spray INSTILL 2 SPRAYS INTO EACH NOSTRIL ONCE DAILY IF NEEDED    . gabapentin (NEURONTIN) 100 MG capsule Take 1 capsule by mouth 2 (two) times daily.    . megestrol (MEGACE) 400 MG/10ML suspension Take 10 mLs (400 mg total) by mouth 2 (two) times daily. 240 mL 0  . mirabegron ER (MYRBETRIQ) 25 MG TB24 tablet Take 25 mg by mouth daily.    . OXYCONTIN 10 MG 12 hr tablet Take 1 tablet by mouth 2 (two) times daily.    . prochlorperazine (COMPAZINE) 10 MG tablet Take 1 tablet (10 mg total) by mouth every 6 (six) hours as needed for nausea or vomiting. (Patient not taking: Reported on 06/24/2019) 30 tablet 0  . tobramycin-dexamethasone (TOBRADEX) ophthalmic solution Instill one drop OD QID x 5 days     No current facility-administered medications for this visit.      Allergies: No Known Allergies  Past Medical History, Surgical history, Social history, and Family History are unchanged on history review.      Lab Results: Lab Results  Component Value Date   WBC 4.7 09/29/2019   HGB 14.1 09/29/2019   HCT 41.4 09/29/2019   MCV 97.0 09/29/2019   PLT 153 09/29/2019     Chemistry      Component Value Date/Time   NA 142 09/29/2019 0947   K 4.3 09/29/2019 0947  CL 107 09/29/2019 0947   CO2 26 09/29/2019 0947   BUN 30 (H) 09/29/2019 0947   CREATININE 1.36 (H) 09/29/2019 0947      Component Value Date/Time   CALCIUM 9.7 09/29/2019 0947   ALKPHOS 70 09/29/2019 0947   AST 22 09/29/2019 0947   ALT 17 09/29/2019 0947   BILITOT 0.6 09/29/2019 0947       Radiological Studies: IMPRESSION: 1. No significant change compared with 06/11/2019. 2. Persistent and mildly progressive left pelvocaliectasis and hydroureter. Similar appearance of residual, asymmetric increased soft tissue in the region of the left UVJ,  nonspecific. 3. No findings to suggest solid organ, osseous or nodal metastasis within the abdomen or pelvis. 4. Aortic atherosclerosis.  Aortic Atherosclerosis (ICD10-I70.0).  Impression and Plan:  85 year old man with:   1.  Bladder cancer diagnosed in May 2020.  He was found to have  T4N0 high-grade urothelial carcinoma.   He is currently on active surveillance after completing definitive therapy outlined above.  Imaging studies obtained on September 29, 2019 were personally reviewed and showed the no evidence of relapsed disease at this time at this time.  Imaging studies did not show any significant changes compared to August studies.  The natural course of this disease and risk of relapse was assessed.  At this time I recommended continued active surveillance and use systemic therapy was clear progression is noted.  He will follow up also with Dr. [name] for potential cystoscopy in the near future.   2. Hydronephrosis:  Unchanged on imaging studies with baseline kidney function is unchanged.   3.  Thrombocytopenia: Related to previous exposure to chemotherapy and has resolved at this time.   4. Follow-up:  Will be in 6 months for repeat evaluation with a CT scan.   I discussed the assessment and treatment plan with the patient. The patient was provided an opportunity to ask questions and all were answered. The patient agreed with the plan and demonstrated an understanding of the instructions.   The patient was advised to call back or seek an in-person evaluation if the symptoms worsen or if the condition fails to improve as anticipated.  I provided 20 minutes of non face-to-face telephone visit time during this encounter, and > 50% was dedicated to updating his disease status, treatment options as well as reviewing imaging studies and future plan of care.  [name], MD 10/05/2019 8:12 AM

## 2019-10-06 ENCOUNTER — Encounter: Payer: Self-pay | Admitting: Cardiology

## 2019-10-06 ENCOUNTER — Telehealth (INDEPENDENT_AMBULATORY_CARE_PROVIDER_SITE_OTHER): Payer: Medicare Other | Admitting: Cardiology

## 2019-10-06 ENCOUNTER — Telehealth: Payer: Self-pay | Admitting: Oncology

## 2019-10-06 ENCOUNTER — Other Ambulatory Visit: Payer: Self-pay

## 2019-10-06 VITALS — BP 121/73 | HR 65 | Ht 67.0 in | Wt 158.0 lb

## 2019-10-06 DIAGNOSIS — I480 Paroxysmal atrial fibrillation: Secondary | ICD-10-CM

## 2019-10-06 NOTE — Telephone Encounter (Signed)
Scheduled per los. Called and left msg. Mailed printout  °

## 2019-10-06 NOTE — Progress Notes (Addendum)
Subjective:   Terry Sullivan, male    DOB: 1923-11-06, 85 y.o.   MRN: [id number]   I connected withthe patient on 10/06/2019 by a telephone call and verified that I am speaking with the correct person using two identifiers.    I offered the patient a video enabled application for a virtual visit. Unfortunately, this could not be accomplished due to technical difficulties/lack of video enabled phone/computer. I discussed the limitations of evaluation and management by telemedicine and the availability of in person appointments. The patient expressed understanding and agreed to proceed.   This visit type was conducted due to national recommendations for restrictions regarding the COVID-19 Pandemic (e.g. social distancing). This format is felt to be most appropriate for this patient at this time. All issues noted in this document were discussed and addressed. No physical exam was performed (except for noted visual exam findings with Tele health visits). The patient has consented to conduct a Tele health visit and understands insurance will be billed.    Chief complaint:  PAF  HPI  85 y.o. [place] male with PAF, h/o high-grade urothelial carcinoma, now s/p chemoradiation.  Patient previously managed by Dr.Vyas, is now retired.  I have not planned the patient in person before.  Today's visit was conducted in the setting of increasing Covid cases and patients concern regarding making an in office visit.  Even at age 55, patient is fairly functional and walks with minimal assistance.  He lives by himself after his wife passed 3 years ago.  His children live nearby.  Patient was diagnosed with urothelial carcinoma in summer 2020, and successfully underwent chemoradiation therapy including carboplatin.  He was previously on Eliquis 2.5 mg twice daily, which was switched to aspirin 81 mg daily after diagnosis of carcinoma and hematuria.  His hematuria has completely resolved.  Patient wants  to know if he should stay on aspirin or should go back on Eliquis.  He does not have any significant chest pain, shortness of breath symptoms at this time.   Past Medical History:  Diagnosis Date  . Bladder cancer ([place]) dx'd 03/2019     Past Surgical History:  Procedure Laterality Date  . BACK SURGERY       Social History   Socioeconomic History  . Marital status: Widowed    Spouse name: Not on file  . Number of children: Not on file  . Years of education: Not on file  . Highest education level: Not on file  Occupational History  . Not on file  Social Needs  . Financial resource strain: Not on file  . Food insecurity    Worry: Not on file    Inability: Not on file  . Transportation needs    Medical: Not on file    Non-medical: Not on file  Tobacco Use  . Smoking status: Never Smoker  . Smokeless tobacco: Never Used  Substance and Sexual Activity  . Alcohol use: Not Currently  . Drug use: Never  . Sexual activity: Not Currently  Lifestyle  . Physical activity    Days per week: Not on file    Minutes per session: Not on file  . Stress: Not on file  Relationships  . Social Herbalist on phone: Not on file    Gets together: Not on file    Attends religious service: Not on file    Active member of club or organization: Not on file    Attends meetings of clubs  or organizations: Not on file    Relationship status: Not on file  . Intimate partner violence    Fear of current or ex partner: Not on file    Emotionally abused: Not on file    Physically abused: Not on file    Forced sexual activity: Not on file  Other Topics Concern  . Not on file  Social History Narrative  . Not on file     Family History  Problem Relation Age of Onset  . Hodgkin's lymphoma Mother   . Bladder Cancer Father   . Cancer Brother      Current Outpatient Medications on File Prior to Visit  Medication Sig Dispense Refill  . acetaminophen (TYLENOL) 500 MG tablet Take  500 mg by mouth 2 (two) times daily.    Marland Kitchen aspirin EC 81 MG tablet Take 1 tablet (81 mg total) by mouth daily. 90 tablet 3  . cetirizine (ZYRTEC) 10 MG tablet Take by mouth as needed.    . doxazosin (CARDURA) 8 MG tablet Take 4 mg by mouth daily.    . fluticasone (FLONASE) 50 MCG/ACT nasal spray INSTILL 2 SPRAYS INTO EACH NOSTRIL ONCE DAILY IF NEEDED    . gabapentin (NEURONTIN) 100 MG capsule Take 1 capsule by mouth 2 (two) times daily.    . megestrol (MEGACE) 400 MG/10ML suspension Take 10 mLs (400 mg total) by mouth 2 (two) times daily. 240 mL 0  . mirabegron ER (MYRBETRIQ) 25 MG TB24 tablet Take 25 mg by mouth daily.    . OXYCONTIN 10 MG 12 hr tablet Take 1 tablet by mouth 2 (two) times daily.    . prochlorperazine (COMPAZINE) 10 MG tablet Take 1 tablet (10 mg total) by mouth every 6 (six) hours as needed for nausea or vomiting. (Patient not taking: Reported on 06/24/2019) 30 tablet 0  . tobramycin-dexamethasone (TOBRADEX) ophthalmic solution Instill one drop OD QID x 5 days     No current facility-administered medications on file prior to visit.     Cardiovascular and other pertinent studies:  EKG 10/12/2018: Probably sinus bradycardia 46 bpm. IRBB. LAFB.  Echocardiogram 09/30/2018: 1. Left ventricle cavity is normal in size. Mild concentric hypertrophy of the left ventricle. Normal global wall motion. Visual EF is 60-65%. 2. No aortic valve regurgitation noted. Mild calcification of the aortic valve annulus. Mild aortic valve leaflet calcification. Mildly restricted aortic valve leaflets. No evidence of aortic valve stenosis. 3. Mild (Grade I) mitral regurgitation. Mild calcification of the mitral valve annulus. 4. Mild tricuspid regurgitation. 5. IVC is dilated with respiratory variation, suggests elevated RA pressure. 6. c.f. echo. of 06/26/2016, PA pressure is normal, no other diagnostic change.   Recent labs: 09/29/2019: Glucose 105, BUN/Cr 30/1.36. EGFR 45 142. Na/K  142/4.3. Rest of the CMP normal H/H 14/41. MCV 92. Platelets 153   Review of Systems  Constitution: Negative for decreased appetite, malaise/fatigue, weight gain and weight loss.  HENT: Negative for congestion.   Eyes: Negative for visual disturbance.  Cardiovascular: Negative for chest pain, dyspnea on exertion, leg swelling, palpitations and syncope.  Respiratory: Negative for cough.   Endocrine: Negative for cold intolerance.  Hematologic/Lymphatic: Does not bruise/bleed easily.  Skin: Negative for itching and rash.  Musculoskeletal: Negative for myalgias.  Gastrointestinal: Negative for abdominal pain, nausea and vomiting.  Genitourinary: Negative for dysuria.  Neurological: Negative for dizziness and weakness.  Psychiatric/Behavioral: The patient is not nervous/anxious.   All other systems reviewed and are negative.  Vitals:   10/06/19 1432  BP: 121/73  Pulse: 65   (Measured by the patient using a home BP monitor)  Body mass index is 24.75 kg/m. Filed Weights   10/06/19 1432  Weight: 158 lb (71.7 kg)    Objective:    Physical Exam Not performed. Telephone visit.        Assessment & Recommendations:   86 y.o. [place] male with PAF, h/o high-grade urothelial carcinoma, now s/p chemoradiation.  PAF: CHA2DS2VASc score 2. Annual stroke risk 2.2%. No active bleeding issues. Treated for urothelial carcinoma. He is currently on Aspirin 81 mg since his diagnosis of urothelial carcinoma. Given that he no ongoing bleeding with a stable Hb, it may be prudent to switch him back to eiliquis, as bleeding risk with Aspirin 81 mg would not necessarily be lower than eiliquis, without providing adequate stroke prevention. With his advanced age and Cr nearing 1.5, I recommend lower dose of eliquis 2.5 mg bid.Discussed with his oncologist Dr. [name], who agreed. Stop Aspirin.  F/u in 6 months.   [name], MD [facility] Pager:  [phone] Office: [phone]

## 2019-10-07 MED ORDER — APIXABAN 2.5 MG PO TABS: 2.5000 mg | ORAL_TABLET | Freq: Two times a day (BID) | ORAL | 2 refills | Status: AC

## 2019-10-07 NOTE — Addendum Note (Signed)
Addended by: [name] on: 10/07/2019 09:22 AM   Modules accepted: Orders

## 2019-10-07 NOTE — Progress Notes (Signed)
Thank you :)

## 2019-10-07 NOTE — Progress Notes (Signed)
I have no objections to switching back to Eliquis. Thanks

## 2019-11-09 ENCOUNTER — Ambulatory Visit: Payer: Medicare Other | Attending: Internal Medicine

## 2019-11-09 DIAGNOSIS — Z23 Encounter for immunization: Secondary | ICD-10-CM

## 2019-11-09 NOTE — Progress Notes (Signed)
   Covid-19 Vaccination Clinic  Name:  Hoa Fraim    MRN: [id number] DOB: 1924-08-04  11/09/2019  Mr. Gravatt was observed post Covid-19 immunization for 30 minutes based on pre-vaccination screening without incidence. He was provided with Vaccine Information Sheet and instruction to access the V-Safe system.   Mr. Gerhold was instructed to call 911 with any severe reactions post vaccine: Marland Kitchen Difficulty breathing  . Swelling of your face and throat  . A fast heartbeat  . A bad rash all over your body  . Dizziness and weakness    Immunizations Administered    Name Date Dose VIS Date Route   Pfizer COVID-19 Vaccine 11/09/2019  9:42 AM 0.3 mL 10/08/2019 Intramuscular   Manufacturer: [place]   Lot: [id number]   [place]: [id number]

## 2019-11-29 ENCOUNTER — Ambulatory Visit: Payer: Medicare Other | Attending: Internal Medicine

## 2019-11-29 DIAGNOSIS — Z23 Encounter for immunization: Secondary | ICD-10-CM | POA: Insufficient documentation

## 2019-11-29 NOTE — Progress Notes (Signed)
   Covid-19 Vaccination Clinic  Name:  Jeannie Yoke    MRN: [id number] DOB: May 20, 1924  11/29/2019  Mr. Sela was observed post Covid-19 immunization for 15 minutes without incidence. He was provided with Vaccine Information Sheet and instruction to access the V-Safe system.   Mr. Haga was instructed to call 911 with any severe reactions post vaccine: Marland Kitchen Difficulty breathing  . Swelling of your face and throat  . A fast heartbeat  . A bad rash all over your body  . Dizziness and weakness    Immunizations Administered    Name Date Dose VIS Date Route   Pfizer COVID-19 Vaccine 11/29/2019  9:24 AM 0.3 mL 10/08/2019 Intramuscular   Manufacturer: [place]   Lot: [id number]   [place]: [id number]

## 2019-12-30 ENCOUNTER — Encounter: Payer: Self-pay | Admitting: Oncology

## 2020-01-02 ENCOUNTER — Emergency Department (HOSPITAL_COMMUNITY): Payer: Medicare Other

## 2020-01-02 ENCOUNTER — Observation Stay (HOSPITAL_COMMUNITY)
Admission: EM | Admit: 2020-01-02 | Discharge: 2020-01-04 | Disposition: A | Discharge status: Home / Self Care | Payer: Medicare Other | Attending: Internal Medicine | Admitting: Internal Medicine

## 2020-01-02 ENCOUNTER — Other Ambulatory Visit: Payer: Self-pay

## 2020-01-02 DIAGNOSIS — D649 Anemia, unspecified: Secondary | ICD-10-CM

## 2020-01-02 DIAGNOSIS — Z20822 Contact with and (suspected) exposure to covid-19: Secondary | ICD-10-CM | POA: Diagnosis not present

## 2020-01-02 DIAGNOSIS — C672 Malignant neoplasm of lateral wall of bladder: Secondary | ICD-10-CM | POA: Diagnosis not present

## 2020-01-02 DIAGNOSIS — Z7901 Long term (current) use of anticoagulants: Secondary | ICD-10-CM | POA: Insufficient documentation

## 2020-01-02 DIAGNOSIS — R0609 Other forms of dyspnea: Secondary | ICD-10-CM | POA: Diagnosis not present

## 2020-01-02 DIAGNOSIS — N183 Chronic kidney disease, stage 3 unspecified: Secondary | ICD-10-CM

## 2020-01-02 DIAGNOSIS — Z79899 Other long term (current) drug therapy: Secondary | ICD-10-CM | POA: Insufficient documentation

## 2020-01-02 DIAGNOSIS — R600 Localized edema: Secondary | ICD-10-CM | POA: Insufficient documentation

## 2020-01-02 DIAGNOSIS — R0602 Shortness of breath: Secondary | ICD-10-CM | POA: Diagnosis present

## 2020-01-02 DIAGNOSIS — D6489 Other specified anemias: Secondary | ICD-10-CM | POA: Diagnosis not present

## 2020-01-02 DIAGNOSIS — C679 Malignant neoplasm of bladder, unspecified: Secondary | ICD-10-CM

## 2020-01-02 DIAGNOSIS — I4891 Unspecified atrial fibrillation: Principal | ICD-10-CM | POA: Diagnosis present

## 2020-01-02 DIAGNOSIS — R509 Fever, unspecified: Secondary | ICD-10-CM

## 2020-01-02 LAB — URINALYSIS, ROUTINE W REFLEX MICROSCOPIC
Bacteria, UA: NONE SEEN
Bilirubin Urine: NEGATIVE
Glucose, UA: NEGATIVE mg/dL
Hgb urine dipstick: NEGATIVE
Ketones, ur: NEGATIVE mg/dL
Leukocytes,Ua: NEGATIVE
Nitrite: NEGATIVE
Protein, ur: 100 mg/dL — AB
Specific Gravity, Urine: 1.023 (ref 1.005–1.030)
pH: 5 (ref 5.0–8.0)

## 2020-01-02 LAB — CBC
HCT: 25.3 % — ABNORMAL LOW (ref 39.0–52.0)
Hemoglobin: 8.3 g/dL — ABNORMAL LOW (ref 13.0–17.0)
MCH: 35.2 pg — ABNORMAL HIGH (ref 26.0–34.0)
MCHC: 32.8 g/dL (ref 30.0–36.0)
MCV: 107.2 fL — ABNORMAL HIGH (ref 80.0–100.0)
Platelets: 135 10*3/uL — ABNORMAL LOW (ref 150–400)
RBC: 2.36 MIL/uL — ABNORMAL LOW (ref 4.22–5.81)
RDW: 15.1 % (ref 11.5–15.5)
WBC: 6.8 10*3/uL (ref 4.0–10.5)
nRBC: 0 % (ref 0.0–0.2)

## 2020-01-02 LAB — BASIC METABOLIC PANEL
Anion gap: 9 (ref 5–15)
BUN: 26 mg/dL — ABNORMAL HIGH (ref 8–23)
CO2: 25 mmol/L (ref 22–32)
Calcium: 8.7 mg/dL — ABNORMAL LOW (ref 8.9–10.3)
Chloride: 107 mmol/L (ref 98–111)
Creatinine, Ser: 1.38 mg/dL — ABNORMAL HIGH (ref 0.61–1.24)
GFR calc Af Amer: 51 mL/min — ABNORMAL LOW (ref >60)
GFR calc non Af Amer: 44 mL/min — ABNORMAL LOW (ref >60)
Glucose, Bld: 176 mg/dL — ABNORMAL HIGH (ref 70–99)
Potassium: 3.7 mmol/L (ref 3.5–5.1)
Sodium: 141 mmol/L (ref 135–145)

## 2020-01-02 LAB — PREPARE RBC (CROSSMATCH)

## 2020-01-02 LAB — ABO/RH: ABO/RH(D): O NEG

## 2020-01-02 LAB — POC OCCULT BLOOD, ED: Fecal Occult Bld: NEGATIVE

## 2020-01-02 MED ORDER — AMIODARONE HCL IN DEXTROSE 360-4.14 MG/200ML-% IV SOLN
60.0000 mg/h | INTRAVENOUS | Status: AC
  Administered 2020-01-02: 60 mg/h via INTRAVENOUS
  Filled 2020-01-02: qty 200

## 2020-01-02 MED ORDER — DOXAZOSIN MESYLATE 4 MG PO TABS
4.0000 mg | ORAL_TABLET | Freq: Every day | ORAL | Status: DC
  Administered 2020-01-03 – 2020-01-04 (×2): 4 mg via ORAL
  Filled 2020-01-02 (×2): qty 1

## 2020-01-02 MED ORDER — ONDANSETRON HCL 4 MG/2ML IJ SOLN: 4.0000 mg | Freq: Four times a day (QID) | INTRAMUSCULAR | Status: DC | PRN

## 2020-01-02 MED ORDER — ACETAMINOPHEN 325 MG PO TABS
650.0000 mg | ORAL_TABLET | Freq: Once | ORAL | Status: AC
  Administered 2020-01-02: 21:00:00 650 mg via ORAL
  Filled 2020-01-02: qty 2

## 2020-01-02 MED ORDER — METOPROLOL TARTRATE 5 MG/5ML IV SOLN
5.0000 mg | Freq: Once | INTRAVENOUS | Status: AC
  Administered 2020-01-02: 5 mg via INTRAVENOUS
  Filled 2020-01-02: qty 5

## 2020-01-02 MED ORDER — SODIUM CHLORIDE 0.9 % IV SOLN: INTRAVENOUS | Status: DC

## 2020-01-02 MED ORDER — GABAPENTIN 100 MG PO CAPS
100.0000 mg | ORAL_CAPSULE | Freq: Two times a day (BID) | ORAL | Status: DC
  Administered 2020-01-03 – 2020-01-04 (×4): 100 mg via ORAL
  Filled 2020-01-02 (×4): qty 1

## 2020-01-02 MED ORDER — OXYCODONE HCL ER 10 MG PO T12A
10.0000 mg | EXTENDED_RELEASE_TABLET | Freq: Two times a day (BID) | ORAL | Status: DC
  Administered 2020-01-03 – 2020-01-04 (×4): 10 mg via ORAL
  Filled 2020-01-02 (×4): qty 1

## 2020-01-02 MED ORDER — SODIUM CHLORIDE 0.9 % IV BOLUS
500.0000 mL | Freq: Once | INTRAVENOUS | Status: AC
  Administered 2020-01-02: 21:00:00 500 mL via INTRAVENOUS

## 2020-01-02 MED ORDER — AMIODARONE HCL IN DEXTROSE 360-4.14 MG/200ML-% IV SOLN
30.0000 mg/h | INTRAVENOUS | Status: DC
  Administered 2020-01-03: 02:00:00 30 mg/h via INTRAVENOUS
  Filled 2020-01-02: qty 200

## 2020-01-02 MED ORDER — SODIUM CHLORIDE 0.9 % IV BOLUS
500.0000 mL | Freq: Once | INTRAVENOUS | Status: AC
  Administered 2020-01-02: 16:00:00 500 mL via INTRAVENOUS

## 2020-01-02 MED ORDER — MIRABEGRON ER 25 MG PO TB24
25.0000 mg | ORAL_TABLET | Freq: Every day | ORAL | Status: DC
  Administered 2020-01-03 – 2020-01-04 (×2): 25 mg via ORAL
  Filled 2020-01-02 (×3): qty 1

## 2020-01-02 MED ORDER — ACETAMINOPHEN 325 MG PO TABS: 650.0000 mg | ORAL_TABLET | ORAL | Status: DC | PRN

## 2020-01-02 MED ORDER — ETOMIDATE 2 MG/ML IV SOLN
0.1500 mg/kg | Freq: Once | INTRAVENOUS | Status: AC
  Administered 2020-01-02: 19:00:00 11.22 mg via INTRAVENOUS
  Filled 2020-01-02: qty 10

## 2020-01-02 MED ORDER — SODIUM CHLORIDE 0.9% FLUSH
3.0000 mL | Freq: Once | INTRAVENOUS | Status: AC
  Administered 2020-01-02: 3 mL via INTRAVENOUS

## 2020-01-02 MED ORDER — SODIUM CHLORIDE 0.9 % IV SOLN: 10.0000 mL/h | Freq: Once | INTRAVENOUS | Status: DC

## 2020-01-02 NOTE — ED Provider Notes (Signed)
[facility] EMERGENCY DEPARTMENT Provider Note   CSN: [id number] Arrival date & time: 01/02/20  1527     History Chief Complaint  Patient presents with  . Atrial Fibrillation  . Shortness of Breath    Terry Sullivan is a 86 y.o. male.  HPI He presents for evaluation of rapid atrial fibrillation, discovered after going to an urgent care for evaluation today.  He presented there for malaise, general weakness, shortness of breath, and low-grade temperature, today.  History of atrial fibrillation, currently on Eliquis.  He denies cough, chest pain, headache, neck pain or back pain.  He is taking all of his usual medications.  No known sick contacts.  There are no other known modifying factors.     Past Medical History:  Diagnosis Date  . Bladder cancer ([place]) dx'd 03/2019    Patient Active Problem List   Diagnosis Date Noted  . Paroxysmal atrial fibrillation ([place]) 04/10/2019  . Bradycardia 04/10/2019  . Mild mitral regurgitation 04/10/2019  . 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 Primary osteoarthritis of both hands 12/25/2018  . Trigger ring finger of right hand 12/25/2018    Past Surgical History:  Procedure Laterality Date  . BACK SURGERY         Family History  Problem Relation Age of Onset  . Hodgkin's lymphoma Mother   . Bladder Cancer Father   . Cancer Brother     Social History   Tobacco Use  . Smoking status: Never Smoker  . Smokeless tobacco: Never Used  Substance Use Topics  . Alcohol use: Not Currently  . Drug use: Never    Home Medications Prior to Admission medications   Medication Sig Start Date End Date Taking? Authorizing Provider  acetaminophen (TYLENOL) 500 MG tablet Take 500 mg by mouth 2 (two) times daily.   Yes [provider]  apixaban (ELIQUIS) 2.5 MG TABS tablet Take 1 tablet (2.5 mg total) by mouth 2 (two) times daily. 10/07/19  Yes Patwardhan, Manish J, MD  cetirizine  (ZYRTEC) 10 MG tablet Take by mouth as needed.   Yes [provider]  doxazosin (CARDURA) 8 MG tablet Take 4 mg by mouth daily. 03/10/19  Yes [provider]  fluticasone (FLONASE) 50 MCG/ACT nasal spray INSTILL 2 SPRAYS INTO EACH NOSTRIL ONCE DAILY IF NEEDED 03/24/18  Yes [provider]  gabapentin (NEURONTIN) 100 MG capsule Take 100 mg by mouth 2 (two) times daily.  02/17/19  Yes [provider]  megestrol (MEGACE) 400 MG/10ML suspension Take 10 mLs (400 mg total) by mouth 2 (two) times daily. 08/18/19  Yes Wyatt Portela, MD  mirabegron ER (MYRBETRIQ) 25 MG TB24 tablet Take 25 mg by mouth daily.   Yes [provider]  OXYCONTIN 10 MG 12 hr tablet Take 1 tablet by mouth 2 (two) times daily. 02/23/19  Yes [provider]    Allergies    Patient has no known allergies.  Review of Systems   Review of Systems  All other systems reviewed and are negative.   Physical Exam Updated Vital Signs BP 113/79   Pulse 76   Temp 100.3 F (37.9 C) (Axillary) Comment: MD aware  Resp (!) 28   Ht 5\' 7"  (1.702 m)   Wt 74.8 kg   SpO2 100%   BMI 25.84 kg/m   Physical Exam Vitals and nursing note reviewed.  Constitutional:      General: He is in acute distress.  Appearance: He is well-developed. He is ill-appearing. He is not toxic-appearing or diaphoretic.  HENT:     Head: Normocephalic and atraumatic.     Right Ear: External ear normal.     Left Ear: External ear normal.     Mouth/Throat:     Mouth: Mucous membranes are moist.     Pharynx: No oropharyngeal exudate.  Eyes:     Conjunctiva/sclera: Conjunctivae normal.     Pupils: Pupils are equal, round, and reactive to light.  Neck:     Trachea: Phonation normal.  Cardiovascular:     Rate and Rhythm: Tachycardia present. Rhythm irregular.     Heart sounds: Normal heart sounds. No murmur.  Pulmonary:     Effort: Pulmonary effort is normal. No respiratory distress.     Breath sounds:  Normal breath sounds. No stridor.  Abdominal:     General: There is no distension.     Palpations: Abdomen is soft.     Tenderness: There is no abdominal tenderness.  Musculoskeletal:        General: Normal range of motion.     Cervical back: Normal range of motion and neck supple.     Right lower leg: Edema present.     Left lower leg: Edema present.  Skin:    General: Skin is warm and dry.  Neurological:     Mental Status: He is alert and oriented to person, place, and time.     Cranial Nerves: No cranial nerve deficit.     Sensory: No sensory deficit.     Motor: No abnormal muscle tone.     Coordination: Coordination normal.  Psychiatric:        Mood and Affect: Mood normal.        Behavior: Behavior normal.        Thought Content: Thought content normal.        Judgment: Judgment normal.     ED Results / Procedures / Treatments   Labs (all labs ordered are listed, but only abnormal results are displayed) Labs Reviewed  BASIC METABOLIC PANEL - Abnormal; Notable for the following components:      Result Value   Glucose, Bld 176 (*)    BUN 26 (*)    Creatinine, Ser 1.38 (*)    Calcium 8.7 (*)    GFR calc non Af Amer 44 (*)    GFR calc Af Amer 51 (*)    All other components within normal limits  CBC - Abnormal; Notable for the following components:   RBC 2.36 (*)    Hemoglobin 8.3 (*)    HCT 25.3 (*)    MCV 107.2 (*)    MCH 35.2 (*)    Platelets 135 (*)    All other components within normal limits  URINALYSIS, ROUTINE W REFLEX MICROSCOPIC  POC OCCULT BLOOD, ED  PREPARE RBC (CROSSMATCH)       EKG EKG Interpretation  Date/Time:  Sunday January 02 2020 15:33:07 EST Ventricular Rate:  125 PR Interval:    QRS Duration: 126 QT Interval:  350 QTC Calculation: 505 R Axis:   -61 Text Interpretation: Atrial flutter with variable A-V block Right bundle branch block Left anterior fascicular block * Bifascicular block * Minimal voltage criteria for LVH, may be normal  variant ( R in aVL ) Septal infarct , age undetermined Abnormal ECG Since last tracing rate faster and now in Atrial Flutter Otherwise no significant change Confirmed by Daleen Bo [phone]) on 01/02/2020 4:02:40 PM  Radiology DG Chest Portable 1 View  Result Date: 01/02/2020 CLINICAL DATA:  86 year old with shortness of breath over the past several weeks, who currently has atrial fibrillation with rapid ventricular response. EXAM: PORTABLE CHEST 1 VIEW COMPARISON:  10/27/2013. FINDINGS: Cardiac silhouette mildly enlarged for AP portable technique. Lungs clear. Bronchovascular markings normal. Pulmonary vascularity normal. No visible pleural effusions. No pneumothorax. IMPRESSION: Mild cardiomegaly. No acute cardiopulmonary disease. Electronically Signed   By: Evangeline Dakin M.D.   On: 01/02/2020 16:26    Procedures .Cardioversion  Date/Time: 01/02/2020 7:59 PM Performed by: Daleen Bo, MD Authorized by: Daleen Bo, MD   Consent:    Consent obtained:  Written   Consent given by:  Patient   Risks discussed:  Induced arrhythmia   Alternatives discussed:  No treatment Pre-procedure details:    Cardioversion basis:  Elective   Rhythm:  Atrial flutter   Electrode placement:  Anterior-posterior Patient sedated: Yes. Refer to sedation procedure documentation for details of sedation.  Attempt one:    Cardioversion mode:  Synchronous   Waveform:  Biphasic   Shock (Joules):  50   Shock outcome:  No change in rhythm Attempt two:    Cardioversion mode:  Synchronous   Waveform:  Biphasic   Shock (Joules):  100   Shock outcome:  No change in rhythm Post-procedure details:    Patient status:  Awake   Patient tolerance of procedure:  Tolerated well, no immediate complications .Sedation  Date/Time: 01/02/2020 8:00 PM Performed by: Daleen Bo, MD Authorized by: Daleen Bo, MD   Consent:    Consent obtained:  Written   Consent given by:  Patient   Risks discussed:   Inadequate sedation, vomiting and prolonged hypoxia resulting in organ damage Universal protocol:    Immediately prior to procedure a time out was called: yes   Indications:    Procedure performed:  Cardioversion   Procedure necessitating sedation performed by:  Physician performing sedation Pre-sedation assessment:    Time since last food or drink:  7   ASA classification: class 3 - patient with severe systemic disease     Neck mobility: normal     Mouth opening:  2 finger widths   Mallampati score:  II - soft palate, uvula, fauces visible   Pre-sedation assessments completed and reviewed: airway patency, cardiovascular function, hydration status, mental status and respiratory function     Pre-sedation assessments completed and reviewed: pre-procedure nausea and vomiting status not reviewed and pre-procedure pain level not reviewed     Pre-sedation assessment completed:  01/02/2020 7:10 PM Immediate pre-procedure details:    Reassessment: Patient reassessed immediately prior to procedure     Reviewed: vital signs and relevant labs/tests     Verified: bag valve mask available, emergency equipment available, intubation equipment available and oxygen available   Procedure details (see MAR for exact dosages):    Preoxygenation:  Nasal cannula   Sedation:  Etomidate   Intended level of sedation: deep   Intra-procedure monitoring:  Blood pressure monitoring, cardiac monitor, continuous capnometry, continuous pulse oximetry, frequent LOC assessments and frequent vital sign checks   Intra-procedure events: none     Total Provider sedation time (minutes):  20 Post-procedure details:    Post-sedation assessment completed:  01/02/2020 7:40 PM   Attendance: Constant attendance by certified staff until patient recovered     Recovery: Patient returned to pre-procedure baseline     Post-sedation assessments completed and reviewed: airway patency, cardiovascular function, hydration status, mental status  and respiratory function  Post-sedation assessments completed and reviewed: nausea/vomiting not reviewed and pain level not reviewed     Patient is stable for discharge or admission: yes     Patient tolerance:  Tolerated well, no immediate complications .Critical Care Performed by: Daleen Bo, MD Authorized by: Daleen Bo, MD   Critical care provider statement:    Critical care time (minutes):  65   Critical care start time:  01/02/2020 4:00 PM   Critical care end time:  01/02/2020 8:03 PM   Critical care time was exclusive of:  Separately billable procedures and treating other patients   Critical care was necessary to treat or prevent imminent or life-threatening deterioration of the following conditions:  Circulatory failure   Critical care was time spent personally by me on the following activities:  Blood draw for specimens, development of treatment plan with patient or surrogate, discussions with consultants, evaluation of patient's response to treatment, examination of patient, obtaining history from patient or surrogate, ordering and performing treatments and interventions, ordering and review of laboratory studies, pulse oximetry, re-evaluation of patient's condition, review of old charts and ordering and review of radiographic studies   (including critical care time)  Medications Ordered in ED Medications  0.9 %  sodium chloride infusion (has no administration in time range)  sodium chloride 0.9 % bolus 500 mL (has no administration in time range)  0.9 %  sodium chloride infusion (has no administration in time range)  amiodarone (NEXTERONE PREMIX) 360-4.14 MG/200ML-% (1.8 mg/mL) IV infusion (has no administration in time range)  amiodarone (NEXTERONE PREMIX) 360-4.14 MG/200ML-% (1.8 mg/mL) IV infusion (has no administration in time range)  sodium chloride flush (NS) 0.9 % injection 3 mL (3 mLs Intravenous Given 01/02/20 1618)  sodium chloride 0.9 % bolus 500 mL (0 mLs  Intravenous Stopped 01/02/20 1733)  metoprolol tartrate (LOPRESSOR) injection 5 mg (5 mg Intravenous Given 01/02/20 1623)  etomidate (AMIDATE) injection 11.22 mg (11.22 mg Intravenous Given by Other 01/02/20 1913)    ED Course  I have reviewed the triage vital signs and the nursing notes.  Pertinent labs & imaging results that were available during my care of the patient were reviewed by me and considered in my medical decision making (see chart for details).  Clinical Course as of Jan 02 2004  Nancy Fetter Jan 02, 2020  123456 Basic metabolic panel(!) [EW]  0000000 Normal except hemoglobin low, MCV high  CBC(!) [EW]  1940 No infiltrate or CHF, interpreted by me  DG Chest Portable 1 View [EW]  1950 Blood transfusion ordered   [EW]  1952 Call returned from Dr. Virgina Jock, his cardiologist.  He will see as a Optometrist, tomorrow.  He recommends starting amiodarone infusion without bolus, at this time.   [EW]    Clinical Course User Index [EW] Daleen Bo, MD   MDM Rules/Calculators/A&P                       Patient Vitals for the past 24 hrs:  BP Temp Temp src Pulse Resp SpO2 Height Weight  01/02/20 1935 -- -- -- 76 (!) 28 100 % -- --  01/02/20 1934 -- -- -- (!) 120 (!) 29 100 % -- --  01/02/20 1933 -- -- -- (!) 56 (!) 28 99 % -- --  01/02/20 1932 -- -- -- 73 (!) 28 100 % -- --  01/02/20 1931 -- -- -- (!) 106 (!) 25 100 % -- --  01/02/20 1930 113/79 100.3 F (37.9 C) Axillary  97 16 96 % -- --  01/02/20 1926 -- -- -- 61 (!) 26 100 % -- --  01/02/20 1925 114/66 -- -- 65 (!) 26 100 % -- --  01/02/20 1924 -- -- -- 68 (!) 25 99 % -- --  01/02/20 1923 -- -- -- (!) 112 (!) 27 100 % -- --  01/02/20 1922 127/88 -- -- (!) 103 (!) 28 98 % -- --  01/02/20 1921 (!) 124/112 -- -- 73 (!) 31 100 % -- --  01/02/20 1920 -- -- -- (!) 127 (!) 31 100 % -- --  01/02/20 1919 -- -- -- (!) 113 (!) 29 99 % -- --  01/02/20 1918 124/84 -- -- (!) 105 (!) 28 100 % -- --  01/02/20 1917 -- -- -- (!) 40 (!) 35 (!) 57 %  -- --  01/02/20 1916 -- -- -- -- (!) 33 -- -- --  01/02/20 1915 -- -- -- (!) 118 (!) 26 100 % -- --  01/02/20 1914 -- -- -- (!) 128 (!) 26 100 % -- --  01/02/20 1913 -- 98.6 F (37 C) Oral -- -- -- -- --  01/02/20 1913 -- -- -- (!) 132 (!) 26 100 % -- --  01/02/20 1912 -- -- -- (!) 128 (!) 26 100 % -- --  01/02/20 1911 -- -- -- (!) 134 (!) 25 99 % -- --  01/02/20 1910 -- -- -- (!) 123 (!) 28 100 % -- --  01/02/20 1909 -- -- -- (!) 132 (!) 23 100 % -- --  01/02/20 1908 -- -- -- (!) 128 (!) 28 100 % -- --  01/02/20 1907 -- -- -- (!) 133 17 100 % -- --  01/02/20 1906 -- -- -- 61 (!) 24 100 % -- --  01/02/20 1905 -- -- -- (!) 115 (!) 24 100 % -- --  01/02/20 1904 -- -- -- 65 (!) 26 100 % -- --  01/02/20 1900 (!) 136/94 -- -- (!) 111 (!) 25 100 % -- --  01/02/20 1845 (!) 164/88 -- -- 68 (!) 25 96 % -- --  01/02/20 1830 -- -- -- (!) 111 (!) 25 96 % -- --  01/02/20 1800 133/89 -- -- (!) 111 (!) 24 98 % -- --  01/02/20 1745 132/83 -- -- (!) 103 (!) 30 100 % -- --  01/02/20 1715 124/78 -- -- (!) 49 (!) 24 93 % -- --  01/02/20 1700 122/70 -- -- 89 20 97 % -- --  01/02/20 1630 111/89 -- -- 83 (!) 23 100 % -- --  01/02/20 1627 118/74 -- -- 97 (!) 32 98 % -- --  01/02/20 1626 -- -- -- -- -- -- 5\' 7"  (1.702 m) 74.8 kg  01/02/20 1537 110/79 98.3 F (36.8 C) -- -- (!) 22 95 % -- --    7:55 PM Reevaluation with update and discussion. After initial assessment and treatment, an updated evaluation reveals he is alert, and attempting to void.  Patient and son updated on findings and plan, they are agreeable.Daleen Bo   Medical Decision Making: Nonspecific symptoms, for 2 weeks, likely related to atrial fibrillation and anemia.  No clear source of anemia.  Stool guaiac negative.  He is followed closely by urology following treatment for bladder cancer, no active treatments.  Attempted cardioversion, failed, patient still symptomatic so will require hospitalization for further monitoring and  treatment.  Cardiology consulted.  Amiodarone started.  Patient is Covid negative.  PCR done today at the primary care office, documentation in this document.  CRITICAL CARE- yes Performed by: Daleen Bo   Nursing Notes Reviewed/ Care Coordinated Applicable Imaging Reviewed Interpretation of Laboratory Data incorporated into ED treatment  8:03 PM-Consult complete with hospitalist. Patient case explained and discussed.  He agrees to admit patient for further evaluation and treatment. Call ended at 8:09 PM  Plan: Admit  Final Clinical Impression(s) / ED Diagnoses Final diagnoses:  Atrial fibrillation with RVR ([place])  Anemia, unspecified type  Fever, unspecified fever cause    Rx / DC Orders ED Discharge Orders    None       Daleen Bo, MD 01/02/20 2024

## 2020-01-02 NOTE — ED Notes (Signed)
Second shock given @ 1919

## 2020-01-02 NOTE — H&P (Signed)
History and Physical    Collyn Stalvey [id number] DOB: [birth year] DOA: 01/02/2020  PCP: [name], MD  Patient coming from: Home  I have personally briefly reviewed patient's old medical records in [place]  Chief Complaint: Palpitations, fatigue  HPI: Vale Paley is a 86 y.o. male with medical history significant for paroxysmal atrial fibrillation on Eliquis, bladder cancer s/p radiation and chemotherapy currently on active surveillance, and CKD stage III who presents to the ED for evaluation of palpitations.  Patient reports several months of easy fatigue with ambulating.  Over the last 3-4 days he has been having palpitations, increased fatigue, and generalized weakness.  He denies any typical chest pain, nausea, vomiting, abdominal pain, dysuria.  He denies any obvious bleeding including epistaxis, hemoptysis, hematemesis, hematuria, hematochezia, or melena.  He denies any recent medication changes.  He has been taking Eliquis 2.5 mg twice daily.  ED Course:  Initial vitals showed BP 110/79, rate 144, RR 22, temp 98.3 Fahrenheit, SPO2 95% on room air.  Labs notable for hemoglobin 8.3, platelets 135,000, WBC 6.8, sodium 141, potassium 3.7, bicarb 25, BUN 26, creatinine 1.38, serum glucose 176.  FOBT is negative.  Urinalysis is collected and pending.  Outpatient SARS-CoV-2, flu A/B, RSV PCR panel was negative 01/02/2020.  Documentation under media tab.  Portable chest x-ray is negative for focal consolidation, edema, or effusion.  Patient was given IV Lopressor 5 mg once and 500 cc normal saline.  Cardioversion was attempted in ED without success.  EDP discussed with patient's cardiologist who recommended starting amiodarone infusion and will see patient in the morning.  Patient was ordered to receive 1 unit PRBC transfusion.  The hospitalist service was consulted admit for further evaluation and management.  Review of Systems: All systems reviewed and are negative  except as documented in history of present illness above.   Past Medical History:  Diagnosis Date  . Bladder cancer ([place]) dx'd 03/2019    Past Surgical History:  Procedure Laterality Date  . BACK SURGERY      Social History:  reports that he has never smoked. He has never used smokeless tobacco. He reports previous alcohol use. He reports that he does not use drugs.  No Known Allergies  Family History  Problem Relation Age of Onset  . Hodgkin's lymphoma Mother   . Bladder Cancer Father   . Cancer Brother      Prior to Admission medications   Medication Sig Start Date End Date Taking? Authorizing Provider  acetaminophen (TYLENOL) 500 MG tablet Take 500 mg by mouth 2 (two) times daily.   Yes [provider]  apixaban (ELIQUIS) 2.5 MG TABS tablet Take 1 tablet (2.5 mg total) by mouth 2 (two) times daily. 10/07/19  Yes Patwardhan, Manish J, MD  cetirizine (ZYRTEC) 10 MG tablet Take by mouth as needed.   Yes [provider]  doxazosin (CARDURA) 8 MG tablet Take 4 mg by mouth daily. 03/10/19  Yes [provider]  fluticasone (FLONASE) 50 MCG/ACT nasal spray INSTILL 2 SPRAYS INTO EACH NOSTRIL ONCE DAILY IF NEEDED 03/24/18  Yes [provider]  gabapentin (NEURONTIN) 100 MG capsule Take 100 mg by mouth 2 (two) times daily.  02/17/19  Yes [provider]  megestrol (MEGACE) 400 MG/10ML suspension Take 10 mLs (400 mg total) by mouth 2 (two) times daily. 08/18/19  Yes Wyatt Portela, MD  mirabegron ER (MYRBETRIQ) 25 MG TB24 tablet Take 25 mg by mouth daily.   Yes [provider]  OXYCONTIN 10 MG 12 hr tablet Take 1 tablet by mouth 2 (two) times daily. 02/23/19  Yes [provider]    Physical Exam: Vitals:   01/02/20 1935 01/02/20 1945 01/02/20 2015 01/02/20 2030  BP:  (!) 132/91    Pulse: 76 85 (!) 50 60  Resp: (!) 28 (!) 23 (!) 33 (!) 22  Temp:      TempSrc:      SpO2: 100% 98% 100% 100%  Weight:      Height:        Constitutional: Elderly man resting supine in bed, NAD, calm, comfortable Eyes: PERRL, lids and conjunctivae normal ENMT: Mucous membranes are moist. Posterior pharynx clear of any exudate or lesions.Normal dentition.  Neck: normal, supple, no masses. Respiratory: clear to auscultation bilaterally, no wheezing, no crackles. Normal respiratory effort. No accessory muscle use.  Cardiovascular: Irregularly irregular, no murmurs / rubs / gallops. No extremity edema. 2+ pedal pulses. Abdomen: no tenderness, no masses palpated. No hepatosplenomegaly. Bowel sounds positive.  Musculoskeletal: no clubbing / cyanosis. No joint deformity upper and lower extremities. Good ROM, no contractures. Normal muscle tone.  Skin: no rashes, lesions, ulcers. No induration Neurologic: CN 2-12 grossly intact. Sensation intact, Strength 5/5 in all 4.  Psychiatric: Normal judgment and insight. Alert and oriented x 3. Normal mood.    Labs on Admission: I have personally reviewed following labs and imaging studies  CBC: Recent Labs  Lab 01/02/20 1548  WBC 6.8  HGB 8.3*  HCT 25.3*  MCV 107.2*  PLT [id number]*   Basic Metabolic Panel: Recent Labs  Lab 01/02/20 1548  NA 141  K 3.7  CL 107  CO2 25  GLUCOSE 176*  BUN 26*  CREATININE 1.38*  CALCIUM 8.7*   GFR: Estimated Creatinine Clearance: 31.3 mL/min (A) (by C-G formula based on SCr of 1.38 mg/dL (H)). Liver Function Tests: No results for input(s): AST, ALT, ALKPHOS, BILITOT, PROT, ALBUMIN in the last 168 hours. No results for input(s): LIPASE, AMYLASE in the last 168 hours. No results for input(s): AMMONIA in the last 168 hours. Coagulation Profile: No results for input(s): INR, PROTIME in the last 168 hours. Cardiac Enzymes: No results for input(s): CKTOTAL, CKMB, CKMBINDEX, TROPONINI in the last 168 hours. BNP (last 3 results) No results for input(s): PROBNP in the last 8760 hours. HbA1C: No results for input(s): HGBA1C in the last 72  hours. CBG: No results for input(s): GLUCAP in the last 168 hours. Lipid Profile: No results for input(s): CHOL, HDL, LDLCALC, TRIG, CHOLHDL, LDLDIRECT in the last 72 hours. Thyroid Function Tests: No results for input(s): TSH, T4TOTAL, FREET4, T3FREE, THYROIDAB in the last 72 hours. Anemia Panel: No results for input(s): VITAMINB12, FOLATE, FERRITIN, TIBC, IRON, RETICCTPCT in the last 72 hours. Urine analysis:    Component Value Date/Time   COLORURINE YELLOW 01/02/2020 2003   APPEARANCEUR HAZY (A) 01/02/2020 2003   LABSPEC 1.023 01/02/2020 2003   PHURINE 5.0 01/02/2020 2003   GLUCOSEU NEGATIVE 01/02/2020 2003   HGBUR NEGATIVE 01/02/2020 2003   [place] NEGATIVE 01/02/2020 2003   [place] NEGATIVE 01/02/2020 2003   PROTEINUR 100 (A) 01/02/2020 2003   NITRITE NEGATIVE 01/02/2020 2003   LEUKOCYTESUR NEGATIVE 01/02/2020 2003    Radiological Exams on Admission: DG Chest Portable 1 View  Result Date: 01/02/2020 CLINICAL DATA:  86 year old with shortness of breath over the past several weeks, who currently has atrial fibrillation with rapid ventricular response. EXAM: PORTABLE CHEST 1 VIEW COMPARISON:  10/27/2013. FINDINGS: Cardiac silhouette mildly enlarged  for AP portable technique. Lungs clear. Bronchovascular markings normal. Pulmonary vascularity normal. No visible pleural effusions. No pneumothorax. IMPRESSION: Mild cardiomegaly. No acute cardiopulmonary disease. Electronically Signed   By: Evangeline Dakin M.D.   On: 01/02/2020 16:26    EKG: Independently reviewed. A. fib with RVR, rate 125, RBBB, LAFB.  Assessment/Plan Principal Problem:   Atrial fibrillation with RVR (HCC) Active Problems:   Bladder cancer (HCC)   Anemia   CKD (chronic kidney disease) stage 3, GFR 30-59 ml/min  Garnie Asai is a 86 y.o. male with medical history significant for paroxysmal atrial fibrillation on Eliquis, bladder cancer s/p radiation and chemotherapy currently on active surveillance,  and CKD stage III who is admitted with atrial fibrillation with RVR and anemia.  Atrial fibrillation with RVR: Failed cardioversion in the ED.  Started on IV amiodarone as advised by cardiology.  Not on beta-blocker due to history of bradycardia. -Continue IV amiodarone per cardiology, rate improving -Holding Eliquis with drop in hemoglobin -Cardiology to see in a.m.  Acute on chronic anemia: Hemoglobin 8.3 on admission with elevated MCV.  Baseline around 10.  Denies any obvious bleeding.  Urinalysis without evidence of hematuria.  FOBT is negative. -Holding Eliquis -Patient receiving 1 unit PRBC transfusion -Check iron, ferritin, B12  CKD stage III: Chronic and stable.  Continue to monitor.  Bladder cancer: S/p radiation and chemotherapy currently on active surveillance.  Follows with oncology, Dr. [name].  DVT prophylaxis: SCDs Code Status: DNR, confirmed with patient Family Communication: Discussed with son at bedside Disposition Plan: From home, likely discharge to home pending adequate rate control, blood transfusion with stable hemoglobin, and PT/OT evaluation Consults called: Cardiology consulted by EDP, to see in the morning Admission status: Observation   Zada Finders MD [facility]  If 7PM-7AM, please contact night-coverage [email or web address]  01/02/2020, 8:56 PM

## 2020-01-02 NOTE — ED Triage Notes (Signed)
Pt bib family from MD office with afib rvr and shob. Pt states has been shob for the last few weeks. Denies cp. On eliquis.

## 2020-01-02 NOTE — ED Notes (Signed)
Difficultly obtained BP after med admin. Manual to be obtained

## 2020-01-02 NOTE — ED Notes (Signed)
Attempted to give report to continuing care nurse on Cardiac PCU. Was told that I would be called back in 10 mins.

## 2020-01-02 NOTE — ED Notes (Signed)
Stayed with and assessed the pt. Post status blood transfusion vitals are stable. Pt. Alert and oriented. No signs of a reaction.

## 2020-01-02 NOTE — ED Notes (Signed)
Pt had negative COVID PCR, pic included in MD note

## 2020-01-02 NOTE — ED Notes (Addendum)
First shock given @ [phone]

## 2020-01-03 ENCOUNTER — Observation Stay (HOSPITAL_COMMUNITY): Payer: Medicare Other

## 2020-01-03 ENCOUNTER — Encounter (HOSPITAL_COMMUNITY): Payer: Self-pay | Admitting: Internal Medicine

## 2020-01-03 DIAGNOSIS — N1832 Chronic kidney disease, stage 3b: Secondary | ICD-10-CM

## 2020-01-03 DIAGNOSIS — R0600 Dyspnea, unspecified: Secondary | ICD-10-CM

## 2020-01-03 DIAGNOSIS — D649 Anemia, unspecified: Secondary | ICD-10-CM

## 2020-01-03 DIAGNOSIS — C678 Malignant neoplasm of overlapping sites of bladder: Secondary | ICD-10-CM | POA: Diagnosis not present

## 2020-01-03 DIAGNOSIS — I4891 Unspecified atrial fibrillation: Secondary | ICD-10-CM | POA: Diagnosis not present

## 2020-01-03 LAB — CBC
HCT: 26.5 % — ABNORMAL LOW (ref 39.0–52.0)
Hemoglobin: 8.7 g/dL — ABNORMAL LOW (ref 13.0–17.0)
MCH: 34.4 pg — ABNORMAL HIGH (ref 26.0–34.0)
MCHC: 32.8 g/dL (ref 30.0–36.0)
MCV: 104.7 fL — ABNORMAL HIGH (ref 80.0–100.0)
Platelets: 119 10*3/uL — ABNORMAL LOW (ref 150–400)
RBC: 2.53 MIL/uL — ABNORMAL LOW (ref 4.22–5.81)
RDW: 17.5 % — ABNORMAL HIGH (ref 11.5–15.5)
WBC: 6 10*3/uL (ref 4.0–10.5)
nRBC: 0.3 % — ABNORMAL HIGH (ref 0.0–0.2)

## 2020-01-03 LAB — BASIC METABOLIC PANEL
Anion gap: 12 (ref 5–15)
BUN: 24 mg/dL — ABNORMAL HIGH (ref 8–23)
CO2: 21 mmol/L — ABNORMAL LOW (ref 22–32)
Calcium: 8.5 mg/dL — ABNORMAL LOW (ref 8.9–10.3)
Chloride: 109 mmol/L (ref 98–111)
Creatinine, Ser: 1.14 mg/dL (ref 0.61–1.24)
GFR calc Af Amer: >60 mL/min (ref >60)
GFR calc non Af Amer: 55 mL/min — ABNORMAL LOW (ref >60)
Glucose, Bld: 136 mg/dL — ABNORMAL HIGH (ref 70–99)
Potassium: 4 mmol/L (ref 3.5–5.1)
Sodium: 142 mmol/L (ref 135–145)

## 2020-01-03 LAB — FOLATE: Folate: 20.5 ng/mL (ref >5.9)

## 2020-01-03 LAB — FERRITIN: Ferritin: 1243 ng/mL — ABNORMAL HIGH (ref 24–336)

## 2020-01-03 LAB — TYPE AND SCREEN
ABO/RH(D): O NEG
Antibody Screen: NEGATIVE
Unit division: 0

## 2020-01-03 LAB — BPAM RBC
Blood Product Expiration Date: 202103132359
ISSUE DATE / TIME: 202103072156
Unit Type and Rh: 9500

## 2020-01-03 LAB — IRON AND TIBC
Iron: 69 ug/dL (ref 45–182)
Saturation Ratios: 44 % — ABNORMAL HIGH (ref 17.9–39.5)
TIBC: 157 ug/dL — ABNORMAL LOW (ref 250–450)
UIBC: 88 ug/dL

## 2020-01-03 LAB — VITAMIN B12: Vitamin B-12: 1429 pg/mL — ABNORMAL HIGH (ref 180–914)

## 2020-01-03 MED ORDER — FUROSEMIDE 10 MG/ML IJ SOLN
40.0000 mg | Freq: Once | INTRAMUSCULAR | Status: AC
  Administered 2020-01-03: 15:00:00 40 mg via INTRAVENOUS
  Filled 2020-01-03: qty 4

## 2020-01-03 MED ORDER — AMIODARONE HCL 200 MG PO TABS
200.0000 mg | ORAL_TABLET | Freq: Two times a day (BID) | ORAL | Status: DC
  Administered 2020-01-03 – 2020-01-04 (×3): 200 mg via ORAL
  Filled 2020-01-03 (×3): qty 1

## 2020-01-03 NOTE — Progress Notes (Signed)
PROGRESS NOTE    Terry Sullivan  [id number] DOB: [birth year] DOA: 01/02/2020 PCP: [name], MD   Brief Narrative:  HPI on 01/02/2020 by Dr. Zada Finders Terry Sullivan is a 86 y.o. male with medical history significant for paroxysmal atrial fibrillation on Eliquis, bladder cancer s/p radiation and chemotherapy currently on active surveillance, and CKD stage III who presents to the ED for evaluation of palpitations.  Patient reports several months of easy fatigue with ambulating.  Over the last 3-4 days he has been having palpitations, increased fatigue, and generalized weakness.  He denies any typical chest pain, nausea, vomiting, abdominal pain, dysuria.  He denies any obvious bleeding including epistaxis, hemoptysis, hematemesis, hematuria, hematochezia, or melena.  He denies any recent medication changes.  He has been taking Eliquis 2.5 mg twice daily.  Interim history  Admitted with AF RVR and was placed on amiodarone. Currently feeling short of breath.  Patient also found to have anemia, receiving 1 unit PRBC.  FOBT was negative. Assessment & Plan   Atrial fibrillation with RVR -Cardioversion was attempted in the ED, however failed -Started on IV amiodarone -Back in sinus rhythm  -Cardiology consulted and appreciated-recommended amiodarone 200 mg twice daily, and will reduce to daily as an outpatient -CHA2DS2-VASc 2 -will restart Eliquis and monitor   Dyspnea and lower extremity edema -Per cardiology, lasix was to be given   Acute on chronic anemia -Baseline around 10, hemoglobin on admission 8.3 -was given 1uPRBC -FOBT negative -UA negative for hematuria -Anemia panel showed adequate iron storage -Discussed with Dr. [name] who will follow up with the patient -continue to monitor CBC  Chronic kidney disease, stage IIIb -Creatinine stable  Bladder cancer -Status post radiation and chemotherapy -Follows with oncology and urology  Deconditioning -Patient feeling  very fatigued for several weeks -PT recommended SNF or supervision and family able to provide 24-hour supervision, home health -OT recommended home health  DVT Prophylaxis  Eliquis   Code Status: DNR  Family Communication: None at bedside. Daughter via phone  Disposition Plan: Currently in observation. Admitted from home for AF RVR. Cardiology consulted. Suspect home in 1 day when SOB has improved and workup is completed.   Consultants Cardiology  Procedures  None  Antibiotics   Anti-infectives (From admission, onward)   None      Subjective:   Terry Sullivan seen and examined today.  Patient with shortness of breath and fatigue this morning.  He denies any chest pain, abdominal pain, nausea or vomiting, diarrhea or constipation.  Patient does state that he has dizziness from time to time and tries to get up slowly.  Objective:   Vitals:   01/03/20 0126 01/03/20 0224 01/03/20 0341 01/03/20 0944  BP: 134/76  138/78 (!) 146/66  Pulse: 78 77 73   Resp: (!) 21 19 19    Temp: 98.7 F (37.1 C)  97.7 F (36.5 C)   TempSrc: Oral  Axillary   SpO2: 96% 97% 97%   Weight:   75.1 kg   Height:        Intake/Output Summary (Last 24 hours) at 01/03/2020 1327 Last data filed at 01/03/2020 0300 Gross per 24 hour  Intake 570.98 ml  Output --  Net 570.98 ml   Filed Weights   01/02/20 1626 01/03/20 0341  Weight: 74.8 kg 75.1 kg    Exam  General: Well developed, well nourished, elderly, NAD  HEENT: NCAT, mucous membranes moist.   Cardiovascular: S1 S2 auscultated, RRR, no murmur  Respiratory: Clear to auscultation  bilaterally, tachypneic  Abdomen: Soft, nontender, nondistended, + bowel sounds  Extremities: warm dry without cyanosis clubbing. LE edema B/L   Neuro: AAOx3, nonfocal  Psych: Normal affect and demeanor    Data Reviewed: I have personally reviewed following labs and imaging studies  CBC: Recent Labs  Lab 01/02/20 1548 01/03/20 0328  WBC 6.8 6.0  HGB  8.3* 8.7*  HCT 25.3* 26.5*  MCV 107.2* 104.7*  PLT 135* 123456*   Basic Metabolic Panel: Recent Labs  Lab 01/02/20 1548 01/03/20 0328  NA 141 142  K 3.7 4.0  CL 107 109  CO2 25 21*  GLUCOSE 176* 136*  BUN 26* 24*  CREATININE 1.38* 1.14  CALCIUM 8.7* 8.5*   GFR: Estimated Creatinine Clearance: 37.8 mL/min (by C-G formula based on SCr of 1.14 mg/dL). Liver Function Tests: No results for input(s): AST, ALT, ALKPHOS, BILITOT, PROT, ALBUMIN in the last 168 hours. No results for input(s): LIPASE, AMYLASE in the last 168 hours. No results for input(s): AMMONIA in the last 168 hours. Coagulation Profile: No results for input(s): INR, PROTIME in the last 168 hours. Cardiac Enzymes: No results for input(s): CKTOTAL, CKMB, CKMBINDEX, TROPONINI in the last 168 hours. BNP (last 3 results) No results for input(s): PROBNP in the last 8760 hours. HbA1C: No results for input(s): HGBA1C in the last 72 hours. CBG: No results for input(s): GLUCAP in the last 168 hours. Lipid Profile: No results for input(s): CHOL, HDL, LDLCALC, TRIG, CHOLHDL, LDLDIRECT in the last 72 hours. Thyroid Function Tests: No results for input(s): TSH, T4TOTAL, FREET4, T3FREE, THYROIDAB in the last 72 hours. Anemia Panel: Recent Labs    01/03/20 0328  VITAMINB12 1,429*  FOLATE 20.5  FERRITIN 1,243*  TIBC 157*  IRON 69   Urine analysis:    Component Value Date/Time   COLORURINE YELLOW 01/02/2020 2003   APPEARANCEUR HAZY (A) 01/02/2020 2003   LABSPEC 1.023 01/02/2020 2003   PHURINE 5.0 01/02/2020 2003   GLUCOSEU NEGATIVE 01/02/2020 2003   HGBUR NEGATIVE 01/02/2020 2003   [place] NEGATIVE 01/02/2020 2003   [place] NEGATIVE 01/02/2020 2003   PROTEINUR 100 (A) 01/02/2020 2003   NITRITE NEGATIVE 01/02/2020 2003   LEUKOCYTESUR NEGATIVE 01/02/2020 2003   Sepsis Labs: @LABRCNTIP (procalcitonin:4,lacticidven:4)  )No results found for this or any previous visit (from the past 240 hour(s)).     Radiology Studies: DG Chest Portable 1 View  Result Date: 01/02/2020 CLINICAL DATA:  86 year old with shortness of breath over the past several weeks, who currently has atrial fibrillation with rapid ventricular response. EXAM: PORTABLE CHEST 1 VIEW COMPARISON:  10/27/2013. FINDINGS: Cardiac silhouette mildly enlarged for AP portable technique. Lungs clear. Bronchovascular markings normal. Pulmonary vascularity normal. No visible pleural effusions. No pneumothorax. IMPRESSION: Mild cardiomegaly. No acute cardiopulmonary disease. Electronically Signed   By: Evangeline Dakin M.D.   On: 01/02/2020 16:26     Scheduled Meds: . doxazosin  4 mg Oral Daily  . gabapentin  100 mg Oral BID  . mirabegron ER  25 mg Oral Daily  . oxyCODONE  10 mg Oral BID   Continuous Infusions: . sodium chloride    . amiodarone 30 mg/hr (01/03/20 0205)     LOS: 0 days   Time Spent in minutes   45 minutes  Lew Prout D.O. on 01/03/2020 at 1:27 PM  Between 7am to 7pm - Please see pager noted on [email or web address]  After 7pm go to [email or web address]  And look for the night coverage person covering for me after hours  [facility] Office  [phone]

## 2020-01-03 NOTE — Evaluation (Signed)
Physical Therapy Evaluation Patient Details Name: Terry Sullivan MRN: [id number] DOB: [birth year] Today's Date: 01/03/2020   History of Present Illness  Pt is a 86 y/o male with PMH of paroxysmal atrial fibrillation on Eliquis, bladder cancer s/p radiation and chemotherapy currently on active surveillance, and CKD stage III who presents to the ED for evaluation of palpitations. Admitted for afib with RVR and anemia.   Clinical Impression  Pt admitted secondary to problem above with deficits below. Pt fatiguing very easily during short distance ambulation. Required seated rest. Had lengthy discussion about SNF vs HHPT with 24/7. Feel given current deficits, pt would benefit from SNF level therapies. However, if family able to provide 24/7 assist, will likely be ok to d/c home with HHPT. Will continue to follow acutely to maximize functional mobility independence and safety.     Follow Up Recommendations SNF;Supervision/Assistance - 24 hour(IF family able to provide 24/7, HHPT )    Equipment Recommendations  Other (comment)(TBD)    Recommendations for Other Services       Precautions / Restrictions Precautions Precautions: Fall Restrictions Weight Bearing Restrictions: No      Mobility  Bed Mobility Overal bed mobility: Needs Assistance Bed Mobility: Supine to Sit;Sit to Supine     Supine to sit: Supervision Sit to supine: Supervision   General bed mobility comments: for line mgmt, increased time but no physical assist required  Transfers Overall transfer level: Needs assistance Equipment used: Rolling walker (2 wheeled) Transfers: Sit to/from Stand Sit to Stand: Min assist         General transfer comment: Min A for steadying assist cues for safe hand placement.   Ambulation/Gait Ambulation/Gait assistance: Min guard Gait Distance (Feet): 25 Feet Assistive device: Rolling walker (2 wheeled) Gait Pattern/deviations: Step-through pattern;Decreased stride length;Trunk  flexed Gait velocity: Decreased   General Gait Details: Pt fatiguing very easily and required seated rest secondary to fatigue. DOE at 3/4, however, oxygen sats WFL on RA. Required cues for upright posture.   Stairs            Wheelchair Mobility    Modified Rankin (Stroke Patients Only)       Balance Overall balance assessment: Needs assistance Sitting-balance support: No upper extremity supported;Feet supported Sitting balance-Leahy Scale: Fair     Standing balance support: Bilateral upper extremity supported;During functional activity Standing balance-Leahy Scale: Poor Standing balance comment: Reliant on BUE support                              Pertinent Vitals/Pain Pain Assessment: No/denies pain    Home Living Family/patient expects to be discharged to:: Private residence Living Arrangements: Alone Available Help at Discharge: Family;Personal care attendant;Available PRN/intermittently Type of Home: House Home Access: Stairs to enter Entrance Stairs-Rails: Right Entrance Stairs-Number of Steps: 4 Home Layout: Two level;Able to live on main level with bedroom/bathroom Home Equipment: Walker - 4 wheels;Cane - single point;Shower seat Additional Comments: Daughter present during session and reports she can provide 24/7 support for the remainder of the week.     Prior Function Level of Independence: Independent with assistive device(s)         Comments: uses rollator and cane for mobility, basic ADLs, has 2 aides to assist with meals and cleaning (830-12, 530-730)     Hand Dominance   Dominant Hand: Right    Extremity/Trunk Assessment   Upper Extremity Assessment Upper Extremity Assessment: Defer to OT evaluation  Lower Extremity Assessment Lower Extremity Assessment: Generalized weakness    Cervical / Trunk Assessment Cervical / Trunk Assessment: Kyphotic  Communication   Communication: No difficulties  Cognition  Arousal/Alertness: Awake/alert Behavior During Therapy: WFL for tasks assessed/performed Overall Cognitive Status: Impaired/Different from baseline Area of Impairment: Memory                     Memory: Decreased short-term memory         General Comments: Reports difficulty remembering things and had difficulty with recall this session.       General Comments General comments (skin integrity, edema, etc.): Pt's daughter present during session.     Exercises     Assessment/Plan    PT Assessment Patient needs continued PT services  PT Problem List Decreased strength;Decreased activity tolerance;Decreased balance;Decreased mobility;Decreased knowledge of use of DME;Decreased cognition;Decreased knowledge of precautions       PT Treatment Interventions Stair training;Gait training;DME instruction;Therapeutic activities;Functional mobility training;Therapeutic exercise;Balance training;Patient/family education    PT Goals (Current goals can be found in the Care Plan section)  Acute Rehab PT Goals Patient Stated Goal: to feel better PT Goal Formulation: With patient Time For Goal Achievement: 01/17/20 Potential to Achieve Goals: Good    Frequency Min 3X/week   Barriers to discharge Decreased caregiver support      Co-evaluation               AM-PAC PT "6 Clicks" Mobility  Outcome Measure Help needed turning from your back to your side while in a flat bed without using bedrails?: None Help needed moving from lying on your back to sitting on the side of a flat bed without using bedrails?: None Help needed moving to and from a bed to a chair (including a wheelchair)?: A Little Help needed standing up from a chair using your arms (e.g., wheelchair or bedside chair)?: A Little Help needed to walk in hospital room?: A Little Help needed climbing 3-5 steps with a railing? : A Lot 6 Click Score: 19    End of Session Equipment Utilized During Treatment: Gait  belt Activity Tolerance: Patient limited by fatigue Patient left: in bed;with call bell/phone within reach;with bed alarm set;with family/visitor present Nurse Communication: Mobility status PT Visit Diagnosis: Unsteadiness on feet (R26.81);Muscle weakness (generalized) (M62.81)    Time: [id number] PT Time Calculation (min) (ACUTE ONLY): 30 min   Charges:   PT Evaluation $PT Eval Moderate Complexity: 1 Mod PT Treatments $Gait Training: 8-22 mins        Lou Miner, DPT  Acute Rehabilitation Services  Pager: [phone] Office: [phone]   Rudean Hitt 01/03/2020, 1:39 PM

## 2020-01-03 NOTE — Evaluation (Addendum)
Occupational Therapy Evaluation Patient Details Name: Terry Sullivan MRN: [id number] DOB: 14-Apr-1924 Today's Date: 01/03/2020    History of Present Illness Pt is a 86 y/o male with PMH of paroxysmal atrial fibrillation on Eliquis, bladder cancer s/p radiation and chemotherapy currently on active surveillance, and CKD stage III who presents to the ED for evaluation of palpitations. Admitted for afib with RVR and anemia.    Clinical Impression   PTA patient independent with ADLs, mobility using rollator/cane, and IADL assist from aides (2 for a few hours in am/pm).  [place] currently admitted for above and limited by problem list below, including impaired balance, generalized weakness, decreased activity tolerance and endurance.  Patient demonstrates ability to complete transfers with min guard, in room mobility using RW with min guard, ADLs with min assist to supervision.  He demonstrates significantly decreased activity tolerance with simple grooming task (brushing teeth at sink).  Believe he will benefit from continued OT services while admitted and after dc at [facility] level to optimize independence, safety and tolerance to daily activities.  Pt reports he will talk to his family about increased support, as he really doesn't want to go to rehab.  Will follow acutely.     Follow Up Recommendations  Home health OT;Supervision/Assistance - 24 hour;Other (comment)(aide; may need SNF if unable to have 24/7 support)    Equipment Recommendations  3 in 1 bedside commode    Recommendations for Other Services PT consult     Precautions / Restrictions Precautions Precautions: Fall Restrictions Weight Bearing Restrictions: No      Mobility Bed Mobility Overal bed mobility: Needs Assistance Bed Mobility: Supine to Sit;Sit to Supine     Supine to sit: Supervision Sit to supine: Supervision   General bed mobility comments: for line mgmt, increased time but no physical assist  required  Transfers Overall transfer level: Needs assistance Equipment used: Rolling walker (2 wheeled) Transfers: Sit to/from Stand Sit to Stand: Min guard         General transfer comment: for safety, balance    Balance Overall balance assessment: Needs assistance Sitting-balance support: No upper extremity supported;Feet supported Sitting balance-Leahy Scale: Fair     Standing balance support: Bilateral upper extremity supported;During functional activity Standing balance-Leahy Scale: Poor Standing balance comment: relaint on BUE support, leaning forward to sink during ADLs                            ADL either performed or assessed with clinical judgement   ADL Overall ADL's : Needs assistance/impaired     Grooming: Min guard;Standing   Upper Body Bathing: Set up;Sitting   Lower Body Bathing: Minimal assistance;Sit to/from stand   Upper Body Dressing : Set up;Sitting;Supervision/safety   Lower Body Dressing: Minimal assistance;Sit to/from stand   Toilet Transfer: Min guard;Ambulation;RW           Functional mobility during ADLs: Min guard;Rolling walker General ADL Comments: pt limited by decreased activity tolerance and endurance     Vision Baseline Vision/History: Wears glasses Wears Glasses: At all times Patient Visual Report: No change from baseline       Perception     Praxis      Pertinent Vitals/Pain Pain Assessment: No/denies pain     Hand Dominance Right   Extremity/Trunk Assessment Upper Extremity Assessment Upper Extremity Assessment: Generalized weakness   Lower Extremity Assessment Lower Extremity Assessment: Defer to PT evaluation       Communication Communication Communication:  No difficulties   Cognition Arousal/Alertness: Awake/alert Behavior During Therapy: WFL for tasks assessed/performed Overall Cognitive Status: Within Functional Limits for tasks assessed                                  General Comments: appears WFL    General Comments  VSS, but very fatigued after brushing teeth at sink     Exercises     Shoulder Instructions      Home Living Family/patient expects to be discharged to:: Private residence Living Arrangements: Alone Available Help at Discharge: Personal care attendant;Available PRN/intermittently Type of Home: House Home Access: Stairs to enter [employer] of Steps: 4 Entrance Stairs-Rails: Right Home Layout: Two level;Able to live on main level with bedroom/bathroom     Bathroom Shower/Tub: Hospital doctor Toilet: Handicapped height     Home Equipment: Environmental consultant - 4 wheels;Cane - single point;Shower seat          Prior Functioning/Environment Level of Independence: Independent with assistive device(s)        Comments: uses rollator and cane for mobility, basic ADLs, has 2 aides to assist with meals and cleaning (830-12, 530-730)        OT Problem List: Decreased strength;Decreased activity tolerance;Impaired balance (sitting and/or standing);Decreased knowledge of use of DME or AE;Decreased knowledge of precautions;Cardiopulmonary status limiting activity      OT Treatment/Interventions: Self-care/ADL training;Therapeutic exercise;DME and/or AE instruction;Therapeutic activities;Patient/family education;Balance training;Energy conservation    OT Goals(Current goals can be found in the care plan section) Acute Rehab OT Goals Patient Stated Goal: to get home and feel better (SNF is my last choice) OT Goal Formulation: With patient Time For Goal Achievement: 01/17/20 Potential to Achieve Goals: Good  OT Frequency: Min 2X/week   Barriers to D/C:            Co-evaluation              AM-PAC OT "6 Clicks" Daily Activity     Outcome Measure Help from another person eating meals?: A Little Help from another person taking care of personal grooming?: A Little Help from another person toileting, which  includes using toliet, bedpan, or urinal?: A Little Help from another person bathing (including washing, rinsing, drying)?: A Little Help from another person to put on and taking off regular upper body clothing?: A Little Help from another person to put on and taking off regular lower body clothing?: A Little 6 Click Score: 18   End of Session Equipment Utilized During Treatment: Rolling walker Nurse Communication: Mobility status  Activity Tolerance: Patient tolerated treatment well Patient left: in chair;with call bell/phone within reach;with bed alarm set  OT Visit Diagnosis: Other abnormalities of gait and mobility (R26.89);Muscle weakness (generalized) (M62.81)                Time: [id number] OT Time Calculation (min): 31 min Charges:  OT General Charges $OT Visit: 1 Visit OT Evaluation $OT Eval Moderate Complexity: 1 Mod OT Treatments $Self Care/Home Management : 8-22 mins  Jolaine Artist, OT Acute Rehabilitation Services Pager [phone] Office [phone]   Delight Stare 01/03/2020, 8:45 AM

## 2020-01-03 NOTE — Plan of Care (Signed)
  Problem: Education: Goal: Knowledge of General Education information will improve Description: Including pain rating scale, medication(s)/side effects and non-pharmacologic comfort measures Outcome: Progressing   Problem: Clinical Measurements: Goal: Will remain free from infection Outcome: Progressing Goal: Diagnostic test results will improve Outcome: Progressing   Problem: Activity: Goal: Risk for activity intolerance will decrease Outcome: Progressing   Problem: Coping: Goal: Level of anxiety will decrease Outcome: Progressing   Problem: Pain Managment: Goal: General experience of comfort will improve Outcome: Progressing   Problem: Safety: Goal: Ability to remain free from injury will improve Outcome: Progressing   Elesa Hacker, RN

## 2020-01-03 NOTE — Plan of Care (Signed)
New admit

## 2020-01-03 NOTE — TOC Initial Note (Signed)
Transition of Care [facility]) - Initial/Assessment Note    Patient Details  Name: Terry Sullivan MRN: [id number] Date of Birth: 22-Apr-1924  Transition of Care [facility]) CM/SW Contact:    Arvella Merles, LCSW Phone Number: 01/03/2020, 5:07 PM  Clinical Narrative:                 CSW received consult for possible SNF placement at time of discharge. CSW met bedside with patient and his daughter who was present. CSW spoke with patient and his daughter regarding PT recommendation. They are not sure if they will choose SNF but would like patient bed offers to be faxed out and insurance authorization initiated. CSW discussed insurance authorization process and provided Medicare SNF ratings list. No further questions reported at this time. CSW to continue to follow and assist with discharge planning needs.    Expected Discharge Plan: Skilled Nursing Facility Barriers to Discharge: Continued Medical Work up, Ship broker   Patient Goals and CMS Choice   CMS Medicare.gov Compare Post Acute Care list provided to:: Patient Choice offered to / list presented to : Patient  Expected Discharge Plan and Services Expected Discharge Plan: [place] arrangements for the past 2 months: Single Family Home                                      Prior Living Arrangements/Services Living arrangements for the past 2 months: Single Family Home Lives with:: Self Patient language and need for interpreter reviewed:: Yes Do you feel safe going back to the place where you live?: Yes      Need for Family Participation in Patient Care: No (Comment) Care giver support system in place?: Yes (comment)   Criminal Activity/Legal Involvement Pertinent to Current Situation/Hospitalization: No - Comment as needed  Activities of Daily Living Home Assistive Devices/Equipment: Cane (specify quad or straight), Grab bars in shower, Grab bars around toilet, Hearing aid,  Wheelchair ADL Screening (condition at time of admission) Patient's cognitive ability adequate to safely complete daily activities?: Yes Is the patient deaf or have difficulty hearing?: Yes Does the patient have difficulty seeing, even when wearing glasses/contacts?: No Does the patient have difficulty concentrating, remembering, or making decisions?: No Patient able to express need for assistance with ADLs?: Yes Does the patient have difficulty dressing or bathing?: No Independently performs ADLs?: Yes (appropriate for developmental age) Does the patient have difficulty walking or climbing stairs?: Yes Weakness of Legs: Both Weakness of Arms/Hands: None  Permission Sought/Granted Permission sought to share information with : Facility Sport and exercise psychologist, Family Supports Permission granted to share information with : Yes, Verbal Permission Granted     Permission granted to share info w AGENCY: SNF        Emotional Assessment Appearance:: Appears stated age Attitude/Demeanor/Rapport: Lethargic Affect (typically observed): Calm Orientation: : Oriented to Self, Oriented to Place, Oriented to  Time, Oriented to Situation Alcohol / Substance Use: Not Applicable Psych Involvement: No (comment)  Admission diagnosis:  Atrial fibrillation with RVR (HCC) [I48.91] Fever, unspecified fever cause [R50.9] Anemia, unspecified type [D64.9] Patient Active Problem List   Diagnosis Date Noted  . Atrial fibrillation with RVR ([place]) 01/02/2020  . Anemia 01/02/2020  . CKD (chronic kidney disease) stage 3, GFR 30-59 ml/min   . Paroxysmal atrial fibrillation ([place]) 04/10/2019  . Bradycardia 04/10/2019  . Mild mitral regurgitation 04/10/2019  . Bladder  cancer ([place]) 03/17/2019  . Primary osteoarthritis of both hands 12/25/2018  . Trigger ring finger of right hand 12/25/2018   PCP:  [name], MD Pharmacy:   [facility] [address] [place] [address] [place] [address] [place] [address] Phone: [phone] Fax: [phone]     Social Determinants of Health (SDOH) Interventions    Readmission Risk Interventions No flowsheet data found.

## 2020-01-03 NOTE — Consult Note (Signed)
CARDIOLOGY CONSULT NOTE  Patient ID: Terry Sullivan MRN: [id number] DOB/AGE: April 19, 1924 86 y.o.  Admit date: 01/02/2020 Referring Physician: [facility] ED Reason for Consultation:  Afib  HPI:   86 y.o. [place] male with PAF, h/o high-grade urothelial carcinoma, now s/p chemoradiation.  Patient presented to the ED on 01/02/20 with complaints of palpitations and shortness of breath. In the ED, he was in RVR with Fib/flutter. He had two unsuccessful attempts with cardioversion. He also had Hb from from 14 in 09/2019 to 8 now. In the ED, he also had low grade temp of 100.3 F.   This morning, his breathing has improved but not back to baseline yet.   I last had a televisit with the patient in 09/2019. Even at age 56, patient is fairly functional and walks with minimal assistance.  He lives by himself after his wife passed in 2017. Patient was diagnosed with urothelial carcinoma in summer 2020, and successfully underwent chemoradiation therapy including carboplatin.  He was previously on Eliquis 2.5 mg twice daily, which was switched to aspirin 81 mg daily after diagnosis of carcinoma and hematuria.  His hematuria had then completely resolved. In 09/2019, I had switched him back from Aspirin to eliquis 2.5 mg bid.     Past Medical History:  Diagnosis Date  . Bladder cancer ([place]) dx'd 03/2019     Past Surgical History:  Procedure Laterality Date  . BACK SURGERY       Family History  Problem Relation Age of Onset  . Hodgkin's lymphoma Mother   . Bladder Cancer Father   . Cancer Brother      Social History: Social History   Socioeconomic History  . Marital status: Widowed    Spouse name: Not on file  . Number of children: 5  . Years of education: Not on file  . Highest education level: Not on file  Occupational History  . Not on file  Tobacco Use  . Smoking status: Never Smoker  . Smokeless tobacco: Never Used  Substance and Sexual Activity  . Alcohol use: Not Currently  .  Drug use: Never  . Sexual activity: Not Currently  Other Topics Concern  . Not on file  Social History Narrative  . Not on file   Social Determinants of Health   Financial Resource Strain:   . Difficulty of Paying Living Expenses: Not on file  Food Insecurity:   . Worried About Charity fundraiser in the Last Year: Not on file  . Ran Out of Food in the Last Year: Not on file  Transportation Needs:   . Lack of Transportation (Medical): Not on file  . Lack of Transportation (Non-Medical): Not on file  Physical Activity:   . Days of Exercise per Week: Not on file  . Minutes of Exercise per Session: Not on file  Stress:   . Feeling of Stress : Not on file  Social Connections:   . Frequency of Communication with Friends and Family: Not on file  . Frequency of Social Gatherings with Friends and Family: Not on file  . Attends Religious Services: Not on file  . Active Member of Clubs or Organizations: Not on file  . Attends Archivist Meetings: Not on file  . Marital Status: Not on file  Intimate Partner Violence:   . Fear of Current or Ex-Partner: Not on file  . Emotionally Abused: Not on file  . Physically Abused: Not on file  . Sexually Abused: Not on file  Medications Prior to Admission  Medication Sig Dispense Refill Last Dose  . acetaminophen (TYLENOL) 500 MG tablet Take 500 mg by mouth 2 (two) times daily.   01/01/2020 at Unknown time  . apixaban (ELIQUIS) 2.5 MG TABS tablet Take 1 tablet (2.5 mg total) by mouth 2 (two) times daily. 180 tablet 2 01/02/2020 at 8a  . cetirizine (ZYRTEC) 10 MG tablet Take by mouth as needed.   01/02/2020 at Unknown time  . doxazosin (CARDURA) 8 MG tablet Take 4 mg by mouth daily.   01/02/2020 at Unknown time  . fluticasone (FLONASE) 50 MCG/ACT nasal spray INSTILL 2 SPRAYS INTO EACH NOSTRIL ONCE DAILY IF NEEDED   01/01/2020 at Unknown time  . gabapentin (NEURONTIN) 100 MG capsule Take 100 mg by mouth 2 (two) times daily.    01/02/2020 at  Unknown time  . megestrol (MEGACE) 400 MG/10ML suspension Take 10 mLs (400 mg total) by mouth 2 (two) times daily. 240 mL 0 01/02/2020 at Unknown time  . mirabegron ER (MYRBETRIQ) 25 MG TB24 tablet Take 25 mg by mouth daily.   Past Month at Unknown time  . OXYCONTIN 10 MG 12 hr tablet Take 1 tablet by mouth 2 (two) times daily.   01/02/2020 at Unknown time    Review of Systems  Constitution: Negative for decreased appetite, malaise/fatigue, weight gain and weight loss.  HENT: Negative for congestion.   Eyes: Negative for visual disturbance.  Cardiovascular: Positive for palpitations. Negative for chest pain, dyspnea on exertion, leg swelling and syncope.  Respiratory: Positive for shortness of breath. Negative for cough.   Endocrine: Negative for cold intolerance.  Hematologic/Lymphatic: Does not bruise/bleed easily.  Skin: Negative for itching and rash.  Musculoskeletal: Negative for myalgias.  Gastrointestinal: Negative for abdominal pain, nausea and vomiting.  Genitourinary: Negative for dysuria.  Neurological: Negative for dizziness and weakness.  Psychiatric/Behavioral: The patient is not nervous/anxious.   All other systems reviewed and are negative.     Physical Exam: Physical Exam  Constitutional: He is oriented to person, place, and time. He appears well-developed and well-nourished. No distress.  HENT:  Head: Normocephalic and atraumatic.  Eyes: Pupils are equal, round, and reactive to light. Conjunctivae are normal.  Neck: No JVD present.  Cardiovascular: Normal rate, regular rhythm, normal heart sounds and intact distal pulses.  No murmur heard. Pulmonary/Chest: Effort normal and breath sounds normal. Tachypnea (Mild) noted. He has no wheezes. He has no rales.  Abdominal: Soft. Bowel sounds are normal. There is no rebound.  Musculoskeletal:        General: Edema (1-2+ bl) present.  Lymphadenopathy:    He has no cervical adenopathy.  Neurological: He is alert and  oriented to person, place, and time. No cranial nerve deficit.  Skin: Skin is warm and dry.  Psychiatric: He has a normal mood and affect.  Nursing note and vitals reviewed.    Labs:   Lab Results  Component Value Date   WBC 6.0 01/03/2020   HGB 8.7 (L) 01/03/2020   HCT 26.5 (L) 01/03/2020   MCV 104.7 (H) 01/03/2020   PLT 119 (L) 01/03/2020    Recent Labs  Lab 01/03/20 0328  NA 142  K 4.0  CL 109  CO2 21*  BUN 24*  CREATININE 1.14  CALCIUM 8.5*  GLUCOSE 136*      Radiology: DG Chest Portable 1 View  Result Date: 01/02/2020 CLINICAL DATA:  86 year old with shortness of breath over the past several weeks, who currently has atrial fibrillation with  rapid ventricular response. EXAM: PORTABLE CHEST 1 VIEW COMPARISON:  10/27/2013. FINDINGS: Cardiac silhouette mildly enlarged for AP portable technique. Lungs clear. Bronchovascular markings normal. Pulmonary vascularity normal. No visible pleural effusions. No pneumothorax. IMPRESSION: Mild cardiomegaly. No acute cardiopulmonary disease. Electronically Signed   By: Evangeline Dakin M.D.   On: 01/02/2020 16:26    Scheduled Meds: . doxazosin  4 mg Oral Daily  . gabapentin  100 mg Oral BID  . mirabegron ER  25 mg Oral Daily  . oxyCODONE  10 mg Oral BID   Continuous Infusions: . sodium chloride    . amiodarone 30 mg/hr (01/03/20 0205)   PRN Meds:.acetaminophen, ondansetron (ZOFRAN) IV  CARDIAC STUDIES:  EKG 01/02/2020: Afib w/RVR 125 bpm. LAFB. RBBB  Telemetry 01/03/2020: Sinus rhythm  Echocardiogram 09/30/2018: 1. Left ventricle cavity is normal in size. Mild concentric hypertrophy of the left ventricle. Normal global wall motion. Visual EF is 60-65%. 2. No aortic valve regurgitation noted. Mild calcification of the aortic valve annulus. Mild aortic valve leaflet calcification. Mildly restricted aortic valve leaflets. No evidence of aortic valve stenosis. 3. Mild (Grade I) mitral regurgitation. Mild calcification  of the mitral valve annulus. 4. Mild tricuspid regurgitation. 5. IVC is dilated with respiratory variation, suggests elevated RA pressure. 6. c.f. echo. of 06/26/2016, PA pressure is normal, no other diagnostic change.   Recent labs: 09/29/2019: Glucose 105, BUN/Cr 30/1.36. EGFR 45 142. Na/K 142/4.3. Rest of the CMP normal H/H 14/41. MCV 92. Platelets 153   Assessment & Recommendations:  86 y.o. [place] male with PAF, h/o high-grade urothelial carcinoma, now s/p chemoradiation, admitted with palpitations, shortness of breath.  Palpitations/shortness of breath: Paroxysmal Afib/flutter with RVR on admission. Now in sinus rhythm.  Suspect RVR may have led to acute diastolic dysfunction. Recommend echocardiogram, IV lasix 40 mg once. Recommend amiodarone 200 mg bid for now. Will reduce to 200 mg qd outpatient.  Monitor for any s/s of infection, as he had low grade temp 100.3 F on admission. CHA2DS2VASc score 2. Annual stroke risk 2.2%. His Hb is only minimal lower than his baseline of around 10. He received 1 U PRBC in the ED. (Hb of 14 in 09/2019 was probably an outlier). Okay to continue eliquis 2.5 mg bid.   [name], MD 01/03/2020, 7:11 AM [facility] Pager: [phone] Office: [phone] If no answer Cell [phone]

## 2020-01-03 NOTE — Progress Notes (Signed)
  Echocardiogram 2D Echocardiogram has been performed.  Terry Sullivan 01/03/2020, 4:37 PM

## 2020-01-04 DIAGNOSIS — D649 Anemia, unspecified: Secondary | ICD-10-CM | POA: Diagnosis not present

## 2020-01-04 DIAGNOSIS — C678 Malignant neoplasm of overlapping sites of bladder: Secondary | ICD-10-CM | POA: Diagnosis not present

## 2020-01-04 DIAGNOSIS — I4891 Unspecified atrial fibrillation: Secondary | ICD-10-CM | POA: Diagnosis not present

## 2020-01-04 DIAGNOSIS — N1832 Chronic kidney disease, stage 3b: Secondary | ICD-10-CM | POA: Diagnosis not present

## 2020-01-04 LAB — BASIC METABOLIC PANEL
Anion gap: 12 (ref 5–15)
BUN: 24 mg/dL — ABNORMAL HIGH (ref 8–23)
CO2: 24 mmol/L (ref 22–32)
Calcium: 8.6 mg/dL — ABNORMAL LOW (ref 8.9–10.3)
Chloride: 104 mmol/L (ref 98–111)
Creatinine, Ser: 1.32 mg/dL — ABNORMAL HIGH (ref 0.61–1.24)
GFR calc Af Amer: 54 mL/min — ABNORMAL LOW (ref >60)
GFR calc non Af Amer: 46 mL/min — ABNORMAL LOW (ref >60)
Glucose, Bld: 124 mg/dL — ABNORMAL HIGH (ref 70–99)
Potassium: 4.1 mmol/L (ref 3.5–5.1)
Sodium: 140 mmol/L (ref 135–145)

## 2020-01-04 LAB — ECHOCARDIOGRAM COMPLETE
Height: 67 in
Weight: 2648 oz

## 2020-01-04 LAB — MAGNESIUM: Magnesium: 1.9 mg/dL (ref 1.7–2.4)

## 2020-01-04 LAB — HEMOGLOBIN AND HEMATOCRIT, BLOOD
HCT: 25.7 % — ABNORMAL LOW (ref 39.0–52.0)
Hemoglobin: 8.4 g/dL — ABNORMAL LOW (ref 13.0–17.0)

## 2020-01-04 MED ORDER — AMIODARONE HCL 200 MG PO TABS: 200.0000 mg | ORAL_TABLET | Freq: Two times a day (BID) | ORAL | 0 refills | Status: DC

## 2020-01-04 MED ORDER — FUROSEMIDE 20 MG PO TABS: 20.0000 mg | ORAL_TABLET | Freq: Every day | ORAL | 0 refills | Status: DC

## 2020-01-04 NOTE — TOC Progression Note (Signed)
Transition of Care [facility]) - Progression Note    Patient Details  Name: Terry Sullivan MRN: [id number] Date of Birth: [birth year]  [facility]) CM/SW Contact  Graves-Bigelow, Ocie Cornfield, RN Phone Number: 01/04/2020, 4:31 PM  Clinical Narrative:  Case Manager received notice from physical therapy that patient has recommendations for home health. Patient has declined [facility] at this time. Case Manager did speak with patient and daughter- both agreeable to home health services. Patient has durable medical equipment rolling walker and cane. Case Manager did provide patient with the order for a 3n1- no DME here at [facility]-patient to take to medical supply store. Medicare.gov choice provided and patient chose [facility]- referral provided to liaison Butch Penny- start of care to begin within 24-48 hours post transition home. Daughter to provide 24 hour supervision-for about 3-4 days and then the patient will have personal care services in the home 2 x day. Daughter to provide transportation home. No further needs from Case Manager at this time.     Expected Discharge Plan: [place] Barriers to Discharge: Barriers Resolved  Expected Discharge Plan and Services Expected Discharge Plan: [place] In-house Referral: NA Discharge Planning Services: CM Consult Post Acute Care Choice: [place] arrangements for the past 2 months: Single Family Home Expected Discharge Date: 01/04/20               DME Arranged: (Order provided to daughter to take to medical supply store.) DME Agency: NA       HH Arranged: PT [place] Agency: [place] ([place]) Date HH Agency Contacted: 01/04/20 Time [place]: 1630 Representative spoke with at [place]: [place] ([place]) Interventions    Readmission Risk Interventions No flowsheet data found.

## 2020-01-04 NOTE — Discharge Instructions (Signed)

## 2020-01-04 NOTE — Progress Notes (Signed)
Subjective:  Feels much better this morning. Shortness of breath and leg edema resolved.   Maintaining sinus rhythm.  Objective:  Vital Signs in the last 24 hours: Temp:  [97.6 F (36.4 C)-98.3 F (36.8 C)] 97.6 F (36.4 C) (03/09 0600) Pulse Rate:  [65-84] 77 (03/09 0600) BP: (124-128)/(62-75) 124/70 (03/09 0600) SpO2:  [95 %-97 %] 95 % (03/09 0600) Weight:  [76.9 kg] 76.9 kg (03/09 0600)  Intake/Output from previous day: 03/08 0701 - 03/09 0700 In: 100 [P.O.:100] Out: 1600 [Urine:1600]  Physical Exam  Constitutional: He is oriented to person, place, and time. He appears well-developed and well-nourished. No distress.  HENT:  Head: Normocephalic and atraumatic.  Eyes: Pupils are equal, round, and reactive to light. Conjunctivae are normal.  Neck: No JVD present.  Cardiovascular: Normal rate, regular rhythm and intact distal pulses.  Murmur heard.  Harsh midsystolic murmur is present with a grade of 1/6 at the upper right sternal border radiating to the neck. Pulmonary/Chest: Effort normal and breath sounds normal. He has no wheezes. He has no rales.  Abdominal: Soft. Bowel sounds are normal. There is no rebound.  Musculoskeletal:        General: No edema.  Lymphadenopathy:    He has no cervical adenopathy.  Neurological: He is alert and oriented to person, place, and time. No cranial nerve deficit.  Skin: Skin is warm and dry.  Psychiatric: He has a normal mood and affect.  Nursing note and vitals reviewed.    Lab Results: BMP Recent Labs    01/02/20 1548 01/03/20 0328 01/04/20 0301  NA 141 142 140  K 3.7 4.0 4.1  CL 107 109 104  CO2 25 21* 24  GLUCOSE 176* 136* 124*  BUN 26* 24* 24*  CREATININE 1.38* 1.14 1.32*  CALCIUM 8.7* 8.5* 8.6*  GFRNONAA 44* 55* 46*  GFRAA 51* >60 54*    CBC Recent Labs  Lab 01/03/20 0328 01/03/20 0328 01/04/20 0301  WBC 6.0  --   --   RBC 2.53*  --   --   HGB 8.7*   < > 8.4*  HCT 26.5*   < > 25.7*  PLT 119*  --   --    MCV 104.7*  --   --   MCH 34.4*  --   --   MCHC 32.8  --   --   RDW 17.5*  --   --    < > = values in this interval not displayed.    HEMOGLOBIN A1C No results found for: HGBA1C, MPG  Cardiac Panel (last 3 results) No results for input(s): CKTOTAL, CKMB, TROPONINI, RELINDX in the last 8760 hours.  BNP (last 3 results) No results for input(s): BNP in the last 8760 hours.  TSH No results for input(s): TSH in the last 8760 hours.  Lipid Panel  No results found for: CHOL, TRIG, HDL, CHOLHDL, VLDL, LDLCALC, LDLDIRECT   Hepatic Function Panel Recent Labs    05/20/19 1405 06/11/19 0926 09/29/19 0947  PROT 6.8 6.8 7.7  ALBUMIN 4.1 3.9 4.5  AST 16 21 22   ALT 12 18 17   ALKPHOS 59 62 70  BILITOT 0.4 0.8 0.6    Cardiac Studies:  EKG 01/02/2020: Afib w/RVR 125 bpm. LAFB. RBBB  Telemetry 01/03/2020: Sinus rhythm  Echocardiogram 01/03/2020: 1. Left ventricular ejection fraction, by estimation, is 55 to 60%. The  left ventricle has normal function. The left ventricle has no regional  wall motion abnormalities. Inadequate Doppler data to assess diastolic  function.  2. Right ventricular systolic function is low normal. The right  ventricular size is normal. There is moderately elevated pulmonary artery  systolic pressure.  3. Trileaflet aortic valve with moderate sclerosis without significant  stenosis.  4. The mitral valve is grossly normal. Mild to moderate mitral valve  regurgitation.  5. Tricuspid valve regurgitation is mild to moderate.  6. Moderate pulmonary hypertension. Estimated PASP 58 mmHg.   Assessment & Recommendations:  86 y.o.Cacuasianmalewith PAF, h/ohigh-grade urothelial carcinoma, now s/p chemoradiation, admitted with palpitations, shortness of breath.  PAF: Unsuccessful cardioversion attempt in the ED on 3/7. However, in sinus rhythm now.  Recommend amiodarone 200 mg bid for now. Will reduce to 200 mg qd outpatient.  CHA2DS2VASc score 2.  Annual stroke risk 2.2%. His Hb is only minimal lower than his baseline of around 10. He received 1 U PRBC in the ED. (Hb of 14 in 09/2019 was probably an outlier). Okay to continue eliquis 2.5 mg bid.   Moderate TR, mod pulmonary hypertension: Recommend medical management for now with lasix PO 20 mg daily.   He seems much better today. Consider repeat PT evaluation. He would like to go home with home health rather than SNF, if possible and safe.   Will sign off. Outpatient follow up arranged on 3/25.  [name], M.D. [facility], [place] Pager: [phone] Office: [phone]

## 2020-01-04 NOTE — Progress Notes (Signed)
Physical Therapy Treatment Patient Details Name: Terry Sullivan MRN: [id number] DOB: [birth year] Today's Date: 01/04/2020    History of Present Illness Pt is a 86 y/o male with PMH of paroxysmal atrial fibrillation on Eliquis, bladder cancer s/p radiation and chemotherapy currently on active surveillance, and CKD stage III who presents to the ED for evaluation of palpitations. Admitted for afib with RVR and anemia.     PT Comments    Pt making some progress with mobility. Therapists have recommended pt have 24hour care at home or atleast for waking hours. Noted pt has poor insight into own deficits and seems to think once home may return to prior level functioning. Daughter reports she is there for a while but not permanently, there are other family members in town but not consistent supervision/assisatnce. Pt has aide who is possibly able to help with ADLs etc but not sure, as pt states he does his own ADLs. This session pt was needing stand by assist to min guard, needing cues for both safety and also sequencing at times. He was able to ambulate approx 179ft with RW and min guard but fatigues quickly needing increased rest time between tasks. At this time d/c recommendation is still supervision 24hrs if not able to get this then SNF would be better option.     Follow Up Recommendations  SNF;Supervision/Assistance - 24 hour     Equipment Recommendations  None recommended by PT    Recommendations for Other Services       Precautions / Restrictions Precautions Precautions: Fall Restrictions Weight Bearing Restrictions: No    Mobility  Bed Mobility Overal bed mobility: Needs Assistance Bed Mobility: Supine to Sit     Supine to sit: Supervision;HOB elevated(uses bed fixtures to complete)        Transfers Overall transfer level: Needs assistance Equipment used: Rolling walker (2 wheeled) Transfers: Sit to/from Stand Sit to Stand: Min guard;Supervision         General  transfer comment: from bed, commode with grab bars, needs cues for walker safety  Ambulation/Gait Ambulation/Gait assistance: Min guard Gait Distance (Feet): 100 Feet Assistive device: Rolling walker (2 wheeled) Gait Pattern/deviations: Step-through pattern;Decreased stride length;Trunk flexed Gait velocity: Decreased   General Gait Details: sats seem to be [facility] on room air but pt does fatigue quickly   Stairs             Wheelchair Mobility    Modified Rankin (Stroke Patients Only)       Balance Overall balance assessment: Needs assistance Sitting-balance support: Feet supported Sitting balance-Leahy Scale: Fair     Standing balance support: During functional activity;Bilateral upper extremity supported Standing balance-Leahy Scale: Poor Standing balance comment: needs UE supported for safety                            Cognition Arousal/Alertness: Awake/alert Behavior During Therapy: WFL for tasks assessed/performed Overall Cognitive Status: Difficult to assess                                 General Comments: both pt and daughter in room do not seem to grasp safety concerns and implications, pt thinks he was fine before and will be fine, daughter thinks in a few days at home he will be back to [facility]      Exercises Other Exercises Other Exercises: standing maching x 10  Other Exercises: sit<>stand  x 5    General Comments        Pertinent Vitals/Pain Pain Assessment: No/denies pain    Home Living                      Prior Function            PT Goals (current goals can now be found in the care plan section) Acute Rehab PT Goals Patient Stated Goal: to go home PT Goal Formulation: With patient/family Time For Goal Achievement: 01/17/20 Potential to Achieve Goals: Good Progress towards PT goals: Progressing toward goals    Frequency    Min 3X/week      PT Plan Current plan remains appropriate     Co-evaluation              AM-PAC PT "6 Clicks" Mobility   Outcome Measure  Help needed turning from your back to your side while in a flat bed without using bedrails?: None Help needed moving from lying on your back to sitting on the side of a flat bed without using bedrails?: None Help needed moving to and from a bed to a chair (including a wheelchair)?: A Little Help needed standing up from a chair using your arms (e.g., wheelchair or bedside chair)?: A Little Help needed to walk in hospital room?: A Little Help needed climbing 3-5 steps with a railing? : A Lot 6 Click Score: 19    End of Session Equipment Utilized During Treatment: Gait belt Activity Tolerance: Patient limited by fatigue;Patient limited by lethargy Patient left: in chair;with call bell/phone within reach;with family/visitor present Nurse Communication: Mobility status PT Visit Diagnosis: Unsteadiness on feet (R26.81);Muscle weakness (generalized) (M62.81)     Time: 1521-1600 PT Time Calculation (min) (ACUTE ONLY): 39 min  Charges:  $Gait Training: 8-22 mins $Therapeutic Exercise: 8-22 mins $Therapeutic Activity: 8-22 mins                     Horald Chestnut, PT    Delford Field 01/04/2020, 4:16 PM

## 2020-01-04 NOTE — Progress Notes (Signed)
Occupational Therapy Treatment Patient Details Name: Terry Sullivan MRN: [id number] DOB: 03/08/1924 Today's Date: 01/04/2020    History of present illness Pt is a 86 y/o male with PMH of paroxysmal atrial fibrillation on Eliquis, bladder cancer s/p radiation and chemotherapy currently on active surveillance, and CKD stage III who presents to the ED for evaluation of palpitations. Admitted for afib with RVR and anemia.    OT comments  Pt progressing towards acute OT goals. Focus of session was increasing activity tolerance during OOB ADLs. Pt completed 2 laps walking in his room then stood to wash hands. DOE 2/4 after OOB activity but noted to recover in a timely manner during seated rest break and O2 sats on RA were in the upper 90s. D/c plan remains appropriate.    Follow Up Recommendations  Home health OT;Supervision/Assistance - 24 hour    Equipment Recommendations  3 in 1 bedside commode    Recommendations for Other Services      Precautions / Restrictions Precautions Precautions: Fall Restrictions Weight Bearing Restrictions: No       Mobility Bed Mobility Overal bed mobility: Needs Assistance Bed Mobility: Supine to Sit     Supine to sit: Supervision     General bed mobility comments: for line mgmt, increased time but no physical assist required  Transfers Overall transfer level: Needs assistance Equipment used: Rolling walker (2 wheeled) Transfers: Sit to/from Stand Sit to Stand: Min guard         General transfer comment: min guard for safety. to/from EOB and recliner    Balance Overall balance assessment: Needs assistance Sitting-balance support: No upper extremity supported;Feet supported Sitting balance-Leahy Scale: Fair     Standing balance support: Bilateral upper extremity supported;During functional activity Standing balance-Leahy Scale: Poor Standing balance comment: Reliant on BUE support                            ADL either  performed or assessed with clinical judgement   ADL Overall ADL's : Needs assistance/impaired     Grooming: Wash/dry hands;Min Dispensing optician: Min guard;Ambulation;RW             General ADL Comments: pt walked bathroom distance and back and stood to wash hands this session. DOE 2/4 with activity, O2 upper 90s on RA. Recovered in a timely manner with rest break at end of session.     Vision       Perception     Praxis      Cognition Arousal/Alertness: Awake/alert Behavior During Therapy: WFL for tasks assessed/performed Overall Cognitive Status: No family/caregiver present to determine baseline cognitive functioning Area of Impairment: Memory                     Memory: Decreased short-term memory                  Exercises     Shoulder Instructions       General Comments      Pertinent Vitals/ Pain       Pain Assessment: No/denies pain  Home Living                                          Prior Functioning/Environment  Frequency  Min 2X/week        Progress Toward Goals  OT Goals(current goals can now be found in the care plan section)  Progress towards OT goals: Progressing toward goals  Acute Rehab OT Goals Patient Stated Goal: to feel better OT Goal Formulation: With patient Time For Goal Achievement: 01/17/20 Potential to Achieve Goals: Good ADL Goals Pt Will Perform Grooming: with modified independence;standing Pt Will Perform Lower Body Dressing: with modified independence;sit to/from stand Pt Will Transfer to Toilet: with modified independence;ambulating Pt Will Perform Tub/Shower Transfer: Shower transfer;ambulating;shower seat;rolling walker Additional ADL Goal #1: Pt will demonstrate use of 3 energy conservation techniques during ADL routine to optimize safety and independence.  Plan Discharge plan remains appropriate    Co-evaluation                  AM-PAC OT "6 Clicks" Daily Activity     Outcome Measure   Help from another person eating meals?: None Help from another person taking care of personal grooming?: None Help from another person toileting, which includes using toliet, bedpan, or urinal?: A Little Help from another person bathing (including washing, rinsing, drying)?: A Little Help from another person to put on and taking off regular upper body clothing?: None Help from another person to put on and taking off regular lower body clothing?: A Little 6 Click Score: 21    End of Session Equipment Utilized During Treatment: Rolling walker  OT Visit Diagnosis: Other abnormalities of gait and mobility (R26.89);Muscle weakness (generalized) (M62.81)   Activity Tolerance Patient tolerated treatment well   Patient Left in chair;with call bell/phone within reach;with bed alarm set   Nurse Communication          Time: [id number] OT Time Calculation (min): 25 min  Charges: OT General Charges $OT Visit: 1 Visit OT Treatments $Self Care/Home Management : 23-37 mins  Terry Sullivan, OT Acute Rehabilitation Services Pager: [phone] Office: [phone]    Hortencia Pilar 01/04/2020, 11:02 AM

## 2020-01-04 NOTE — Discharge Summary (Addendum)
Physician Discharge Summary  [name] [id number] DOB: 1924/07/31 DOA: 01/02/2020  PCP: [name], MD  Admit date: 01/02/2020 Discharge date: 01/04/2020  Time spent: 45 minutes  Recommendations for Outpatient Follow-up:  Patient will be discharged to home with home health services.  Patient will need to follow up with primary care provider within one week of discharge.  Patient should continue medications as prescribed.  Patient should follow a heart healthy diet.    Discharge Diagnoses:  Atrial fibrillation with RVR Dyspnea and lower extremity edema Acute on chronic anemia Chronic kidney disease, stage IIIb Bladder cancer Deconditioning  Discharge Condition: Stable  Diet recommendation: heart healthy  Filed Weights   01/02/20 1626 01/03/20 0341 01/04/20 0600  Weight: 74.8 kg 75.1 kg 76.9 kg    History of present illness:  on 01/02/2020 by Dr. Zada Finders Terry Sullivan a 86 y.o.malewith medical history significant forparoxysmal atrial fibrillation on Eliquis, bladder cancer s/p radiation and chemotherapy currently on active surveillance, and CKD stage III who presents to the ED for evaluation ofpalpitations.  Patient reports several months of easy fatigue with ambulating. Over the last 3-4 days he has been having palpitations, increased fatigue, and generalized weakness.He denies any typical chest pain, nausea, vomiting, abdominal pain, dysuria. He denies any obvious bleeding including epistaxis, hemoptysis, hematemesis, hematuria, hematochezia, or melena. He denies any recent medication changes. He has been taking Eliquis 2.5 mg twice daily.  Hospital Course:  Atrial fibrillation with RVR -Cardioversion was attempted in the ED, however failed -Started on IV amiodarone -Back in sinus rhythm  -Cardiology consulted and appreciated-recommended amiodarone 200 mg twice daily, and will reduce to daily as an outpatient -CHA2DS2-VASc 2 -Echocardiogram: EF of  55 to 60%, inadequate Doppler data to assess diastolic function. -Continue Eliquis  Dyspnea and lower extremity edema/moderate TR with moderate pulmonary hypertension -Per cardiology, lasix was to be given  -Echo as above -Patient will be discharged with oral Lasix 20 mg daily and follow-up with cardiology  Acute on chronic anemia -Baseline around 10, hemoglobin on admission 8.3 -was given 1uPRBC -FOBT negative -UA negative for hematuria -Anemia panel showed adequate iron storage -Discussed with Dr. [name] who will follow up with the patient -Hemoglobin 8.4  Chronic kidney disease, stage IIIb -Creatinine stable  Bladder cancer -Status post radiation and chemotherapy -Follows with oncology and urology  Deconditioning -Patient feeling very fatigued for several weeks -PT recommended SNF or supervision and family able to provide 24-hour supervision, home health -OT recommended home health, 3in1 bedside commode  Code status: DNR  Procedures: Echocardiogram  Consultations: Cardiology   Discharge Exam: Vitals:   01/03/20 2041 01/04/20 0600  BP: 126/75 124/70  Pulse: 84 77  Resp:    Temp: 98.3 F (36.8 C) 97.6 F (36.4 C)  SpO2: 96% 95%     General: Well developed, well nourished, NAD, appears stated age  HEENT: NCAT, mucous membranes moist.  Cardiovascular: S1 S2 auscultated, +SEM, RRR  Respiratory: Clear to auscultation bilaterally  Abdomen: Soft, nontender, nondistended, + bowel sounds  Extremities: warm dry without cyanosis clubbing or edema  Neuro: AAOx3, nonfocal  Psych: Appropriate mood and affect, pleasant  Discharge Instructions Discharge Instructions    Discharge instructions   Complete by: As directed    Patient will be discharged to home with home health services.  Patient will need to follow up with primary care provider within one week of discharge.  Patient should continue medications as prescribed.  Patient should follow a heart  healthy diet.  Allergies as of 01/04/2020   No Known Allergies     Medication List    TAKE these medications   acetaminophen 500 MG tablet Commonly known as: TYLENOL Take 500 mg by mouth 2 (two) times daily.   amiodarone 200 MG tablet Commonly known as: PACERONE Take 1 tablet (200 mg total) by mouth 2 (two) times daily.   apixaban 2.5 MG Tabs tablet Commonly known as: Eliquis Take 1 tablet (2.5 mg total) by mouth 2 (two) times daily.   cetirizine 10 MG tablet Commonly known as: ZYRTEC Take by mouth as needed.   doxazosin 8 MG tablet Commonly known as: CARDURA Take 4 mg by mouth daily.   fluticasone 50 MCG/ACT nasal spray Commonly known as: FLONASE INSTILL 2 SPRAYS INTO EACH NOSTRIL ONCE DAILY IF NEEDED   furosemide 20 MG tablet Commonly known as: Lasix Take 1 tablet (20 mg total) by mouth daily.   gabapentin 100 MG capsule Commonly known as: NEURONTIN Take 100 mg by mouth 2 (two) times daily.   megestrol 400 MG/10ML suspension Commonly known as: MEGACE Take 10 mLs (400 mg total) by mouth 2 (two) times daily.   mirabegron ER 25 MG Tb24 tablet Commonly known as: MYRBETRIQ Take 25 mg by mouth daily.   OxyCONTIN 10 mg 12 hr tablet Generic drug: oxyCODONE Take 1 tablet by mouth 2 (two) times daily.            Durable Medical Equipment  (From admission, onward)         Start     Ordered   01/04/20 1329  DME 3-in-1  Once     01/04/20 1329         No Known Allergies Follow-up Information    [name], MD. Schedule an appointment as soon as possible for a visit in 1 week(s).   Specialty: Internal Medicine Why: Hospital follow up  Contact information: [address] [place] [address] [place] [address] [phone]        [name], MD. Go on 01/20/2020.   Specialties: Cardiology, Radiology Why: Hospital follow up Contact information: [place] [address] [phone]            The  results of significant diagnostics from this hospitalization (including imaging, microbiology, ancillary and laboratory) are listed below for reference.    Significant Diagnostic Studies: DG Chest Portable 1 View  Result Date: 01/02/2020 CLINICAL DATA:  86 year old with shortness of breath over the past several weeks, who currently has atrial fibrillation with rapid ventricular response. EXAM: PORTABLE CHEST 1 VIEW COMPARISON:  10/27/2013. FINDINGS: Cardiac silhouette mildly enlarged for AP portable technique. Lungs clear. Bronchovascular markings normal. Pulmonary vascularity normal. No visible pleural effusions. No pneumothorax. IMPRESSION: Mild cardiomegaly. No acute cardiopulmonary disease. Electronically Signed   By: Evangeline Dakin M.D.   On: 01/02/2020 16:26   ECHOCARDIOGRAM COMPLETE  Result Date: 01/04/2020    ECHOCARDIOGRAM REPORT   Patient Name:   Terry Sullivan Date of Exam: 01/03/2020 Medical Rec #:  [id number]       Height:       67.0 in Accession #:    [id number]      Weight:       165.5 lb Date of Birth:  04/22/1924       BSA:          1.866 m Patient Age:    11 years        BP:  128/62 mmHg Patient Gender: M               HR:           77 bpm. Exam Location:  Inpatient Procedure: 2D Echo Indications:     dyspnea 786.09  History:         Patient has no prior history of Echocardiogram examinations.                  Chronic kidney disease. Cancer.; Arrythmias:Atrial                  Fibrillation.  Sonographer:     Johny Chess Referring Phys:  [id number] Reynold Bowen PATWARDHAN Diagnosing Phys: Vernell Leep MD IMPRESSIONS  1. Left ventricular ejection fraction, by estimation, is 55 to 60%. The left ventricle has normal function. The left ventricle has no regional wall motion abnormalities. Inadequate Doppler data to assess diastolic function.  2. Right ventricular systolic function is low normal. The right ventricular size is normal. There is moderately elevated pulmonary artery  systolic pressure.  3. Trileaflet aortic valve with moderate sclerosis without significant stenosis.  4. The mitral valve is grossly normal. Mild to moderate mitral valve regurgitation.  5. Tricuspid valve regurgitation is mild to moderate.  6. Moderate pulmonary hypertension. Estimated PASP 58 mmHg. FINDINGS  Left Ventricle: Left ventricular ejection fraction, by estimation, is 55 to 60%. The left ventricle has normal function. The left ventricle has no regional wall motion abnormalities. The left ventricular internal cavity size was normal in size. There is  no left ventricular hypertrophy. Inadequate Doppler data to assess diastolic function. Right Ventricle: The right ventricular size is normal. No increase in right ventricular wall thickness. Right ventricular systolic function is low normal. There is moderately elevated pulmonary artery systolic pressure. The tricuspid regurgitant velocity  is 3.71 m/s, and with an assumed right atrial pressure of 3 mmHg, the estimated right ventricular systolic pressure is [id number] mmHg. Left Atrium: Left atrial size was normal in size. Right Atrium: Right atrial size was normal in size. Pericardium: Trivial pericardial effusion is present. Mitral Valve: The mitral valve is grossly normal. Mild to moderate mitral valve regurgitation. Tricuspid Valve: The tricuspid valve is grossly normal. Tricuspid valve regurgitation is mild to moderate. Aortic Valve: The aortic valve is tricuspid. Aortic valve regurgitation is not visualized. Mild to moderate aortic valve sclerosis/calcification is present, without any evidence of aortic stenosis. Pulmonic Valve: The pulmonic valve was grossly normal. Pulmonic valve regurgitation is not visualized. Aorta: The aortic root is normal in size and structure. IAS/Shunts: The interatrial septum was not assessed.  LEFT VENTRICLE PLAX 2D LVIDd:         4.60 cm LVIDs:         3.20 cm LV PW:         0.90 cm LV IVS:        0.90 cm LVOT diam:     1.95 cm  LVOT Area:     2.99 cm  RIGHT VENTRICLE RV S prime:     11.50 cm/s TAPSE (M-mode): 1.3 cm LEFT ATRIUM             Index       RIGHT ATRIUM           Index LA diam:        4.20 cm 2.25 cm/m  RA Area:     17.30 cm LA Vol (A2C):   41.2 ml 22.08 ml/m RA Volume:   45.20  ml  24.22 ml/m LA Vol (A4C):   42.5 ml 22.78 ml/m LA Biplane Vol: 43.1 ml 23.10 ml/m   AORTA Ao Root diam: 3.40 cm TRICUSPID VALVE TR Peak grad:   55.1 mmHg TR Vmax:        371.00 cm/s  SHUNTS Systemic Diam: 1.95 cm Vernell Leep MD Electronically signed by Vernell Leep MD Signature Date/Time: 01/04/2020/11:02:40 AM    Final     Microbiology: No results found for this or any previous visit (from the past 240 hour(s)).   Labs: Basic Metabolic Panel: Recent Labs  Lab 01/02/20 1548 01/03/20 0328 01/04/20 0301  NA 141 142 140  K 3.7 4.0 4.1  CL 107 109 104  CO2 25 21* 24  GLUCOSE 176* 136* 124*  BUN 26* 24* 24*  CREATININE 1.38* 1.14 1.32*  CALCIUM 8.7* 8.5* 8.6*  MG  --   --  1.9   Liver Function Tests: No results for input(s): AST, ALT, ALKPHOS, BILITOT, PROT, ALBUMIN in the last 168 hours. No results for input(s): LIPASE, AMYLASE in the last 168 hours. No results for input(s): AMMONIA in the last 168 hours. CBC: Recent Labs  Lab 01/02/20 1548 01/03/20 0328 01/04/20 0301  WBC 6.8 6.0  --   HGB 8.3* 8.7* 8.4*  HCT 25.3* 26.5* 25.7*  MCV 107.2* 104.7*  --   PLT 135* 119*  --    Cardiac Enzymes: No results for input(s): CKTOTAL, CKMB, CKMBINDEX, TROPONINI in the last 168 hours. BNP: BNP (last 3 results) No results for input(s): BNP in the last 8760 hours.  ProBNP (last 3 results) No results for input(s): PROBNP in the last 8760 hours.  CBG: No results for input(s): GLUCAP in the last 168 hours.     Signed:  Cristal Ford  [facility] 01/04/2020, 1:30 PM

## 2020-01-10 ENCOUNTER — Encounter: Payer: Self-pay | Admitting: Oncology

## 2020-01-12 ENCOUNTER — Other Ambulatory Visit: Payer: Self-pay

## 2020-01-12 ENCOUNTER — Telehealth: Payer: Self-pay

## 2020-01-12 DIAGNOSIS — D649 Anemia, unspecified: Secondary | ICD-10-CM

## 2020-01-12 NOTE — Telephone Encounter (Signed)
-----   Message from Wyatt Portela, MD sent at 01/12/2020  1:36 PM EDT ----- Regarding: RE: Physician request Can you set him up for a repeat CBC and 2 units of packed red cell transfusion in the next 48 hours?  Thanks ----- Message ----- From: Teodoro Spray, RN Sent: 01/12/2020   1:09 PM EDT To: Wyatt Portela, MD Subject: Physician request                              Received call from Dr. [name], patients PCP. PCP saw patient today, and patient's Hgb was 8.3. Patient has been SOB with exertion and has been tired. Dr. Laurann Montana wants to know if patient can be seen for possible blood transfusion.   Please advise.  Thank you.

## 2020-01-12 NOTE — Telephone Encounter (Signed)
Scheduling message sent to have patient scheduled for labs and transfusion appointment on 01/13/20. Called and informed patient of date and time of appointment.  Will place blood orders once labs have resulted.

## 2020-01-13 ENCOUNTER — Other Ambulatory Visit: Payer: Self-pay

## 2020-01-13 ENCOUNTER — Inpatient Hospital Stay: Payer: Medicare Other | Attending: Oncology

## 2020-01-13 ENCOUNTER — Inpatient Hospital Stay: Payer: Medicare Other

## 2020-01-13 DIAGNOSIS — I4891 Unspecified atrial fibrillation: Secondary | ICD-10-CM | POA: Diagnosis not present

## 2020-01-13 DIAGNOSIS — Z7901 Long term (current) use of anticoagulants: Secondary | ICD-10-CM | POA: Insufficient documentation

## 2020-01-13 DIAGNOSIS — D696 Thrombocytopenia, unspecified: Secondary | ICD-10-CM | POA: Insufficient documentation

## 2020-01-13 DIAGNOSIS — D649 Anemia, unspecified: Secondary | ICD-10-CM | POA: Insufficient documentation

## 2020-01-13 DIAGNOSIS — Z9221 Personal history of antineoplastic chemotherapy: Secondary | ICD-10-CM | POA: Insufficient documentation

## 2020-01-13 DIAGNOSIS — C678 Malignant neoplasm of overlapping sites of bladder: Secondary | ICD-10-CM

## 2020-01-13 DIAGNOSIS — C672 Malignant neoplasm of lateral wall of bladder: Secondary | ICD-10-CM | POA: Insufficient documentation

## 2020-01-13 DIAGNOSIS — Z79899 Other long term (current) drug therapy: Secondary | ICD-10-CM | POA: Insufficient documentation

## 2020-01-13 DIAGNOSIS — Z923 Personal history of irradiation: Secondary | ICD-10-CM | POA: Insufficient documentation

## 2020-01-13 LAB — CBC WITH DIFFERENTIAL (CANCER CENTER ONLY)
Abs Immature Granulocytes: 0.1 10*3/uL — ABNORMAL HIGH (ref 0.00–0.07)
Band Neutrophils: 1 %
Basophils Absolute: 0 10*3/uL (ref 0.0–0.1)
Basophils Relative: 0 %
Blasts: 5 %
Eosinophils Absolute: 0 10*3/uL (ref 0.0–0.5)
Eosinophils Relative: 0 %
HCT: 25.1 % — ABNORMAL LOW (ref 39.0–52.0)
Hemoglobin: 8.2 g/dL — ABNORMAL LOW (ref 13.0–17.0)
Lymphocytes Relative: 15 %
Lymphs Abs: 1 10*3/uL (ref 0.7–4.0)
MCH: 34.3 pg — ABNORMAL HIGH (ref 26.0–34.0)
MCHC: 32.7 g/dL (ref 30.0–36.0)
MCV: 105 fL — ABNORMAL HIGH (ref 80.0–100.0)
Metamyelocytes Relative: 1 %
Monocytes Absolute: 0.1 10*3/uL (ref 0.1–1.0)
Monocytes Relative: 2 %
Myelocytes: 1 %
Neutro Abs: 5.1 10*3/uL (ref 1.7–7.7)
Neutrophils Relative %: 75 %
Platelet Count: 102 10*3/uL — ABNORMAL LOW (ref 150–400)
RBC: 2.39 MIL/uL — ABNORMAL LOW (ref 4.22–5.81)
RDW: 15.3 % (ref 11.5–15.5)
WBC Count: 6.7 10*3/uL (ref 4.0–10.5)
nRBC: 0 % (ref 0.0–0.2)

## 2020-01-13 LAB — CMP (CANCER CENTER ONLY)
ALT: 28 U/L (ref 0–44)
AST: 23 U/L (ref 15–41)
Albumin: 2.5 g/dL — ABNORMAL LOW (ref 3.5–5.0)
Alkaline Phosphatase: 199 U/L — ABNORMAL HIGH (ref 38–126)
Anion gap: 13 (ref 5–15)
BUN: 28 mg/dL — ABNORMAL HIGH (ref 8–23)
CO2: 25 mmol/L (ref 22–32)
Calcium: 8.6 mg/dL — ABNORMAL LOW (ref 8.9–10.3)
Chloride: 103 mmol/L (ref 98–111)
Creatinine: 1.34 mg/dL — ABNORMAL HIGH (ref 0.61–1.24)
GFR, Est AFR Am: 53 mL/min — ABNORMAL LOW (ref >60)
GFR, Estimated: 45 mL/min — ABNORMAL LOW (ref >60)
Glucose, Bld: 201 mg/dL — ABNORMAL HIGH (ref 70–99)
Potassium: 4 mmol/L (ref 3.5–5.1)
Sodium: 141 mmol/L (ref 135–145)
Total Bilirubin: 0.6 mg/dL (ref 0.3–1.2)
Total Protein: 6.7 g/dL (ref 6.5–8.1)

## 2020-01-13 LAB — ABO/RH: ABO/RH(D): O NEG

## 2020-01-13 LAB — SAMPLE TO BLOOD BANK

## 2020-01-13 LAB — PREPARE RBC (CROSSMATCH)

## 2020-01-13 MED ORDER — ACETAMINOPHEN 325 MG PO TABS
650.0000 mg | ORAL_TABLET | Freq: Once | ORAL | Status: AC
  Administered 2020-01-13: 650 mg via ORAL

## 2020-01-13 MED ORDER — SODIUM CHLORIDE 0.9% IV SOLUTION
250.0000 mL | Freq: Once | INTRAVENOUS | Status: AC
  Administered 2020-01-13: 250 mL via INTRAVENOUS
  Filled 2020-01-13: qty 250

## 2020-01-13 MED ORDER — DIPHENHYDRAMINE HCL 25 MG PO CAPS
ORAL_CAPSULE | ORAL | Status: AC
  Filled 2020-01-13: qty 1

## 2020-01-13 MED ORDER — DIPHENHYDRAMINE HCL 25 MG PO CAPS
25.0000 mg | ORAL_CAPSULE | Freq: Once | ORAL | Status: AC
  Administered 2020-01-13: 25 mg via ORAL

## 2020-01-13 MED ORDER — ACETAMINOPHEN 325 MG PO TABS
ORAL_TABLET | ORAL | Status: AC
  Filled 2020-01-13: qty 2

## 2020-01-13 NOTE — Progress Notes (Signed)
Contacted pt's daughter Izora Gala with appt end time/pickup time per pt request.  Pt received 2 units PRBCs today, tolerated well.  Able to eat/drink/ambulate to restroom w/out any issues.  VSS.  Pt verbalized understanding of d/c instructions and to f/u as needed.

## 2020-01-13 NOTE — Progress Notes (Signed)
Patient's hgb 8.2 this AM. Verbal orders received for 2 units PRBC. Orders entered.

## 2020-01-13 NOTE — Patient Instructions (Signed)

## 2020-01-14 LAB — BPAM RBC
Blood Product Expiration Date: 202103242359
Blood Product Expiration Date: 202104072359
ISSUE DATE / TIME: 202103180957
ISSUE DATE / TIME: 202103180957
Unit Type and Rh: 9500
Unit Type and Rh: 9500

## 2020-01-14 LAB — TYPE AND SCREEN
ABO/RH(D): O NEG
Antibody Screen: NEGATIVE
Unit division: 0
Unit division: 0

## 2020-01-17 ENCOUNTER — Other Ambulatory Visit: Payer: Self-pay

## 2020-01-17 ENCOUNTER — Inpatient Hospital Stay (HOSPITAL_BASED_OUTPATIENT_CLINIC_OR_DEPARTMENT_OTHER): Payer: Medicare Other | Admitting: Oncology

## 2020-01-17 VITALS — BP 118/52 | HR 69 | Temp 99.1°F | Resp 18 | Ht 67.0 in | Wt 167.3 lb

## 2020-01-17 DIAGNOSIS — C672 Malignant neoplasm of lateral wall of bladder: Secondary | ICD-10-CM | POA: Diagnosis not present

## 2020-01-17 DIAGNOSIS — D649 Anemia, unspecified: Secondary | ICD-10-CM

## 2020-01-17 NOTE — Progress Notes (Signed)
Hematology and Oncology Follow   Terry Sullivan [id number] 04-Dec-1923 86 y.o. 01/17/2020 10:05 AM [name], MDGriffin, [name], MD       Principle Diagnosis: 86 year old man with:  1.  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  2.  Anemia diagnosed in March 2021.  Work-up is currently ongoing.   Prior Therapy:  He is status post percutaneous biopsy of his bladder mass obtained on Mar 18, 2019 which confirmed the presence of urothelial carcinoma.  Definitive therapy with radiation as well as weekly carboplatin started on 04/01/2019.  He completed therapy on May 20, 2019.   Current therapy:  Supportive transfusion.   Interim History: Terry Sullivan is here for repeat evaluation.  Since the last visit, he was hospitalized on January 02, 2020 with atrial fibrillation and rapid ventricular response.  He underwent attempted cardioversion failed and was started on IV amiodarone converted to normal sinus rhythm.  He was started on amiodarone at that time.  He was also found to be anemic with a hemoglobin as low as 8.3.  Outpatient follow-up revealed that his hemoglobin was down to 8.2 with a platelet count of 102 and received packed red cell transfusion on January 13, 2020. His peripheral blood did show abnormal immature cells   Clinically, he reports improvement in his performance status and activity level last 2 days after packed red cell transfusion.  He denies any fevers or chills weight loss.  He does report some occasional dyspnea on exertion.  He is ambulating with the help of a cane.  Denies any recent falls or syncope.   Medications: Updated on review. Current Outpatient Medications  Medication Sig Dispense Refill  . acetaminophen (TYLENOL) 500 MG tablet Take 500 mg by mouth 2 (two) times daily.    Marland Kitchen amiodarone (PACERONE) 200 MG tablet Take 1 tablet (200 mg total) by mouth 2 (two) times daily. 30 tablet 0  . apixaban (ELIQUIS) 2.5 MG TABS tablet  Take 1 tablet (2.5 mg total) by mouth 2 (two) times daily. 180 tablet 2  . cetirizine (ZYRTEC) 10 MG tablet Take by mouth as needed.    . doxazosin (CARDURA) 8 MG tablet Take 4 mg by mouth daily.    . fluticasone (FLONASE) 50 MCG/ACT nasal spray INSTILL 2 SPRAYS INTO EACH NOSTRIL ONCE DAILY IF NEEDED    . furosemide (LASIX) 20 MG tablet Take 1 tablet (20 mg total) by mouth daily. 30 tablet 0  . gabapentin (NEURONTIN) 100 MG capsule Take 100 mg by mouth 2 (two) times daily.     . megestrol (MEGACE) 400 MG/10ML suspension Take 10 mLs (400 mg total) by mouth 2 (two) times daily. 240 mL 0  . mirabegron ER (MYRBETRIQ) 25 MG TB24 tablet Take 25 mg by mouth daily.    . OXYCONTIN 10 MG 12 hr tablet Take 1 tablet by mouth 2 (two) times daily.     No current facility-administered medications for this visit.     Allergies: No Known Allergies  Past Medical History, Surgical history, Social history, and Family History are unchanged on history review.  Physical exam:  Blood pressure (!) 118/52, pulse 69, temperature 99.1 F (37.3 C), temperature source Temporal, resp. rate 18, height 5' 7"  (1.702 m), weight 167 lb 4.8 oz (75.9 kg), SpO2 95 %.  ECOG 2  General appearance: Comfortable appearing without any discomfort Head: Normocephalic without any trauma Oropharynx: Mucous membranes are moist and pink without any thrush or ulcers. Eyes: Pupils are equal and  round reactive to light. Lymph nodes: No cervical, supraclavicular, inguinal or axillary lymphadenopathy.   Heart:regular rate and rhythm.  S1 and S2 without leg edema. Lung: Clear without any rhonchi or wheezes.  No dullness to percussion. Abdomin: Soft, nontender, nondistended with good bowel sounds.  No hepatosplenomegaly. Musculoskeletal: No joint deformity or effusion.  Full range of motion noted. Neurological: No deficits noted on motor, sensory and deep tendon reflex exam. Skin: No petechial rash or dryness.  Appeared moist.         Lab Results: Lab Results  Component Value Date   WBC 6.7 01/13/2020   HGB 8.2 (L) 01/13/2020   HCT 25.1 (L) 01/13/2020   MCV 105.0 (H) 01/13/2020   PLT 102 (L) 01/13/2020     Chemistry      Component Value Date/Time   NA 141 01/13/2020 0813   K 4.0 01/13/2020 0813   CL 103 01/13/2020 0813   CO2 25 01/13/2020 0813   BUN 28 (H) 01/13/2020 0813   CREATININE 1.34 (H) 01/13/2020 0813      Component Value Date/Time   CALCIUM 8.6 (L) 01/13/2020 0813   ALKPHOS 199 (H) 01/13/2020 0813   AST 23 01/13/2020 0813   ALT 28 01/13/2020 0813   BILITOT 0.6 01/13/2020 0813       Peripheral smear showed nucleated red cells with rouleaux formation.  White cell count was normal although large immature white cells noted.  Impression and Plan:  86 year old man with:   1.  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  He was found to have  T4N0 high-grade urothelial carcinoma.   He is status post therapy outlined above and currently on active surveillance without any evidence of recurrent disease.  He has no signs or symptoms of disease relapse at this time we will continue to monitor.  Plan is to repeat imaging studies in June 2021.  2.  Anemia with mild thrombocytopenia and abnormal peripheral smear: These findings are concerning for a primary hematological condition.  Differential diagnosis including plasma cell disorder such as multiple myeloma or possibly myelodysplastic syndrome.  Transforming into acute leukemia could also be a possibility at this time.  Risks and benefits of a bone marrow biopsy were reviewed today.  Potential complications that include bleeding, infection and pain were reiterated.  He is risks usually low and the only way to fully quantify his hematological condition would be to perform this biopsy.  This was also discussed with his son via phone and they are both in agreement to proceed.  We will tentatively schedule that in  the next 7 days.   3.  Thrombocytopenia: Platelet count currently above 100,000 without any active bleeding.   4. Follow-up:  Will be in the next 2 weeks for repeat follow-up and evaluation to discuss results of bone marrow biopsy as well as evaluation for possible transfusion.  30  minutes were dedicated to this visit. The time was spent on reviewing laboratory data, discussing treatment options, discussing differential diagnosis and answering questions regarding future plan.     [name], MD 01/17/2020 10:05 AM

## 2020-01-18 ENCOUNTER — Telehealth: Payer: Self-pay | Admitting: Oncology

## 2020-01-18 ENCOUNTER — Other Ambulatory Visit: Payer: Self-pay

## 2020-01-18 ENCOUNTER — Telehealth: Payer: Self-pay

## 2020-01-18 MED ORDER — AMIODARONE HCL 200 MG PO TABS: 200.0000 mg | ORAL_TABLET | Freq: Every day | ORAL | 3 refills | Status: DC

## 2020-01-18 NOTE — Telephone Encounter (Signed)
Please send 200 mg ONCE/day (frequency reduced from twice/day) 60 pills X 3 refills  Thanks MJP

## 2020-01-18 NOTE — Telephone Encounter (Signed)
Telephone encounter:  Reason for call: Amiodarone refill  Usual provider: MP  Last office visit:  10/06/19 VV   Next office visit:02/03/20   Last hospitalization: NA  Current Outpatient Medications on File Prior to Visit  Medication Sig Dispense Refill  . acetaminophen (TYLENOL) 500 MG tablet Take 500 mg by mouth 2 (two) times daily.    Marland Kitchen amiodarone (PACERONE) 200 MG tablet Take 1 tablet (200 mg total) by mouth 2 (two) times daily. 30 tablet 0  . apixaban (ELIQUIS) 2.5 MG TABS tablet Take 1 tablet (2.5 mg total) by mouth 2 (two) times daily. 180 tablet 2  . cetirizine (ZYRTEC) 10 MG tablet Take by mouth as needed.    . doxazosin (CARDURA) 8 MG tablet Take 4 mg by mouth daily.    . fluticasone (FLONASE) 50 MCG/ACT nasal spray INSTILL 2 SPRAYS INTO EACH NOSTRIL ONCE DAILY IF NEEDED    . furosemide (LASIX) 20 MG tablet Take 1 tablet (20 mg total) by mouth daily. 30 tablet 0  . gabapentin (NEURONTIN) 100 MG capsule Take 100 mg by mouth 2 (two) times daily.     . megestrol (MEGACE) 400 MG/10ML suspension Take 10 mLs (400 mg total) by mouth 2 (two) times daily. 240 mL 0  . mirabegron ER (MYRBETRIQ) 25 MG TB24 tablet Take 25 mg by mouth daily.    . OXYCONTIN 10 MG 12 hr tablet Take 1 tablet by mouth 2 (two) times daily.     No current facility-administered medications on file prior to visit.

## 2020-01-18 NOTE — Telephone Encounter (Signed)
Scheduled appt per 3/22 los.  Spoke with pt and he is aware of the appt date and time,

## 2020-01-20 ENCOUNTER — Ambulatory Visit: Payer: Medicare Other | Admitting: Cardiology

## 2020-01-20 ENCOUNTER — Other Ambulatory Visit: Payer: Self-pay | Admitting: Radiology

## 2020-01-24 ENCOUNTER — Other Ambulatory Visit: Payer: Self-pay

## 2020-01-24 ENCOUNTER — Ambulatory Visit (HOSPITAL_COMMUNITY)
Admission: RE | Admit: 2020-01-24 | Discharge: 2020-01-24 | Disposition: A | Discharge status: Home / Self Care | Payer: Medicare Other | Source: Ambulatory Visit | Attending: Oncology | Admitting: Oncology

## 2020-01-24 ENCOUNTER — Ambulatory Visit (HOSPITAL_COMMUNITY)
Admission: RE | Admit: 2020-01-24 | Discharge: 2020-01-24 | Disposition: A | Discharge status: Home / Self Care | Payer: Medicare Other | Source: Ambulatory Visit | Attending: Internal Medicine | Admitting: Internal Medicine

## 2020-01-24 ENCOUNTER — Encounter (HOSPITAL_COMMUNITY): Payer: Self-pay

## 2020-01-24 ENCOUNTER — Encounter: Payer: Self-pay | Admitting: Oncology

## 2020-01-24 DIAGNOSIS — D696 Thrombocytopenia, unspecified: Secondary | ICD-10-CM | POA: Diagnosis not present

## 2020-01-24 DIAGNOSIS — Z9221 Personal history of antineoplastic chemotherapy: Secondary | ICD-10-CM | POA: Insufficient documentation

## 2020-01-24 DIAGNOSIS — Z8551 Personal history of malignant neoplasm of bladder: Secondary | ICD-10-CM | POA: Diagnosis not present

## 2020-01-24 DIAGNOSIS — D649 Anemia, unspecified: Secondary | ICD-10-CM

## 2020-01-24 DIAGNOSIS — Z7901 Long term (current) use of anticoagulants: Secondary | ICD-10-CM | POA: Diagnosis not present

## 2020-01-24 DIAGNOSIS — Z923 Personal history of irradiation: Secondary | ICD-10-CM | POA: Insufficient documentation

## 2020-01-24 DIAGNOSIS — D539 Nutritional anemia, unspecified: Secondary | ICD-10-CM | POA: Insufficient documentation

## 2020-01-24 DIAGNOSIS — Z79899 Other long term (current) drug therapy: Secondary | ICD-10-CM | POA: Insufficient documentation

## 2020-01-24 LAB — CBC WITH DIFFERENTIAL/PLATELET
Abs Immature Granulocytes: 0.3 10*3/uL — ABNORMAL HIGH (ref 0.00–0.07)
Basophils Absolute: 0.1 10*3/uL (ref 0.0–0.1)
Basophils Relative: 1 %
Eosinophils Absolute: 0.1 10*3/uL (ref 0.0–0.5)
Eosinophils Relative: 1 %
HCT: 28.5 % — ABNORMAL LOW (ref 39.0–52.0)
Hemoglobin: 9.1 g/dL — ABNORMAL LOW (ref 13.0–17.0)
Lymphocytes Relative: 12 %
Lymphs Abs: 1 10*3/uL (ref 0.7–4.0)
MCH: 33.3 pg (ref 26.0–34.0)
MCHC: 31.9 g/dL (ref 30.0–36.0)
MCV: 104.4 fL — ABNORMAL HIGH (ref 80.0–100.0)
Monocytes Absolute: 0.2 10*3/uL (ref 0.1–1.0)
Monocytes Relative: 3 %
Myelocytes: 4 %
Neutro Abs: 6.1 10*3/uL (ref 1.7–7.7)
Neutrophils Relative %: 74 %
Other: 5 %
Platelets: 103 10*3/uL — ABNORMAL LOW (ref 150–400)
RBC: 2.73 MIL/uL — ABNORMAL LOW (ref 4.22–5.81)
RDW: 15.3 % (ref 11.5–15.5)
WBC: 8.3 10*3/uL (ref 4.0–10.5)
nRBC: 0 % (ref 0.0–0.2)

## 2020-01-24 LAB — PROTIME-INR
INR: 2 — ABNORMAL HIGH (ref 0.8–1.2)
Prothrombin Time: 22.2 seconds — ABNORMAL HIGH (ref 11.4–15.2)

## 2020-01-24 MED ORDER — FLUMAZENIL 0.5 MG/5ML IV SOLN
INTRAVENOUS | Status: AC
  Filled 2020-01-24: qty 5

## 2020-01-24 MED ORDER — SODIUM CHLORIDE 0.9 % IV SOLN: INTRAVENOUS | Status: DC

## 2020-01-24 MED ORDER — HYDROCODONE-ACETAMINOPHEN 5-325 MG PO TABS: 1.0000 | ORAL_TABLET | ORAL | Status: DC | PRN

## 2020-01-24 MED ORDER — FENTANYL CITRATE (PF) 100 MCG/2ML IJ SOLN
INTRAMUSCULAR | Status: AC | PRN
  Administered 2020-01-24 (×2): 25 ug via INTRAVENOUS

## 2020-01-24 MED ORDER — FENTANYL CITRATE (PF) 100 MCG/2ML IJ SOLN
INTRAMUSCULAR | Status: AC
  Filled 2020-01-24: qty 4

## 2020-01-24 MED ORDER — MIDAZOLAM HCL 2 MG/2ML IJ SOLN
INTRAMUSCULAR | Status: AC
  Filled 2020-01-24: qty 4

## 2020-01-24 MED ORDER — MIDAZOLAM HCL 2 MG/2ML IJ SOLN
INTRAMUSCULAR | Status: AC | PRN
  Administered 2020-01-24: 0.5 mg via INTRAVENOUS

## 2020-01-24 MED ORDER — NALOXONE HCL 0.4 MG/ML IJ SOLN
INTRAMUSCULAR | Status: AC
  Filled 2020-01-24: qty 1

## 2020-01-24 NOTE — H&P (Signed)
Referring Physician(s): Wyatt Portela  Supervising Physician: Jacqulynn Cadet  Patient Status:  [facility]  Chief Complaint:  "I'm having a bone marrow biopsy"  Subjective: Patient familiar to IR service from bladder mass biopsy on 03/18/2019.  He has a history of urothelial carcinoma and is status post chemoradiation.  He now presents with anemia with mild thrombocytopenia and abnormal peripheral blood smear.  He is scheduled today for CT-guided bone marrow biopsy for further evaluation.  He currently denies fever, headache, chest pain, cough, abdominal pain, nausea, vomiting or bleeding.  He does have dyspnea and chronic low back pain.  Past Medical History:  Diagnosis Date  . Bladder cancer ([place]) dx'd 03/2019   Past Surgical History:  Procedure Laterality Date  . BACK SURGERY       Allergies: Patient has no known allergies.  Medications: Prior to Admission medications   Medication Sig Start Date End Date Taking? Authorizing Provider  acetaminophen (TYLENOL) 500 MG tablet Take 500 mg by mouth 2 (two) times daily.    [provider]  amiodarone (PACERONE) 200 MG tablet Take 1 tablet (200 mg total) by mouth daily. 01/18/20   Patwardhan, Reynold Bowen, MD  apixaban (ELIQUIS) 2.5 MG TABS tablet Take 1 tablet (2.5 mg total) by mouth 2 (two) times daily. 10/07/19   Patwardhan, Reynold Bowen, MD  cetirizine (ZYRTEC) 10 MG tablet Take by mouth as needed.    [provider]  doxazosin (CARDURA) 8 MG tablet Take 4 mg by mouth daily. 03/10/19   [provider]  fluticasone (FLONASE) 50 MCG/ACT nasal spray INSTILL 2 SPRAYS INTO EACH NOSTRIL ONCE DAILY IF NEEDED 03/24/18   [provider]  furosemide (LASIX) 20 MG tablet Take 1 tablet (20 mg total) by mouth daily. 01/04/20 01/03/21  Cristal Ford, DO  gabapentin (NEURONTIN) 100 MG capsule Take 100 mg by mouth 2 (two) times daily.  02/17/19   [provider]  megestrol (MEGACE) 400 MG/10ML suspension Take 10  mLs (400 mg total) by mouth 2 (two) times daily. 08/18/19   Wyatt Portela, MD  mirabegron ER (MYRBETRIQ) 25 MG TB24 tablet Take 25 mg by mouth daily.    [provider]  OXYCONTIN 10 MG 12 hr tablet Take 1 tablet by mouth 2 (two) times daily. 02/23/19   [provider]     Vital Signs: Blood pressure 127/61, heart rate 71, respirations 20, O2 sat 99% room air, temperature 98.2    Physical Exam awake, alert.  Chest with diminished breath sounds on left, right clear.  Heart with normal rate, occasional ectopy noted.  Abdomen soft, positive bowel sounds, nontender.  Bilateral lower extremity edema noted.  Imaging: No results found.  Labs:  CBC: Recent Labs    09/29/19 0947 09/29/19 0947 01/02/20 1548 01/03/20 0328 01/04/20 0301 01/13/20 0813  WBC 4.7  --  6.8 6.0  --  6.7  HGB 14.1  --  8.3* 8.7* 8.4* 8.2*  HCT 41.4   < > 25.3* 26.5* 25.7* 25.1*  PLT 153  --  135* 119*  --  102*   < > = values in this interval not displayed.    COAGS: Recent Labs    03/18/19 0801  INR 1.2    BMP: Recent Labs    01/02/20 1548 01/03/20 0328 01/04/20 0301 01/13/20 0813  NA 141 142 140 141  K 3.7 4.0 4.1 4.0  CL 107 109 104 103  CO2 25 21* 24 25  GLUCOSE 176* 136* 124*  201*  BUN 26* 24* 24* 28*  CALCIUM 8.7* 8.5* 8.6* 8.6*  CREATININE 1.38* 1.14 1.32* 1.34*  GFRNONAA 44* 55* 46* 45*  GFRAA 51* >60 54* 53*    LIVER FUNCTION TESTS: Recent Labs    05/20/19 1405 06/11/19 0926 09/29/19 0947 01/13/20 0813  BILITOT 0.4 0.8 0.6 0.6  AST 16 21 22 23   ALT 12 18 17 28   ALKPHOS 59 62 70 199*  PROT 6.8 6.8 7.7 6.7  ALBUMIN 4.1 3.9 4.5 2.5*    Assessment and Plan: Pt with history of urothelial carcinoma 2020 , status post chemoradiation.  He now presents with anemia with mild thrombocytopenia and abnormal peripheral blood smear.  He is scheduled today for CT-guided bone marrow biopsy for further evaluation. Risks and benefits of procedure was discussed with the  patient  including, but not limited to bleeding, infection, damage to adjacent structures or low yield requiring additional tests.  All of the questions were answered and there is agreement to proceed.  Consent signed and in chart.     Electronically Signed: D. Rowe Robert, PA-C 01/24/2020, 9:56 AM   I spent a total of 20 minutes at the the patient's bedside AND on the patient's hospital floor or unit, greater than 50% of which was counseling/coordinating care for CT-guided bone marrow biopsy

## 2020-01-24 NOTE — Progress Notes (Signed)
Actual time 1125 arrival

## 2020-01-24 NOTE — Procedures (Signed)
Interventional Radiology Procedure Note  Procedure: CT guided aspirate and core biopsy of right iliac bone Complications: None Recommendations: - Bedrest supine x 1 hrs - Hydrocodone PRN  Pain - Follow biopsy results  Signed,  Saim Almanza K. Detron Carras, MD   

## 2020-01-24 NOTE — Discharge Instructions (Signed)
Bone Marrow Aspiration and Bone Marrow Biopsy, Adult, Care After This sheet gives you information about how to care for yourself after your procedure. Your health care provider may also give you more specific instructions. If you have problems or questions, contact your health care provider. What can I expect after the procedure? After the procedure, it is common to have:  Mild pain and tenderness.  Swelling.  Bruising. Follow these instructions at home: Puncture site care   Follow instructions from your health care provider about how to take care of the puncture site. Make sure you: ? Wash your hands with soap and water before and after you change your bandage (dressing). If soap and water are not available, use hand sanitizer. ? Change your dressing as told by your health care provider.  Check your puncture site every day for signs of infection. Check for: ? More redness, swelling, or pain. ? Fluid or blood. ? Warmth. ? Pus or a bad smell. Activity  Return to your normal activities as told by your health care provider. Ask your health care provider what activities are safe for you.  Do not lift anything that is heavier than 10 lb (4.5 kg), or the limit that you are told, until your health care provider says that it is safe.  Do not drive for 24 hours if you were given a sedative during your procedure. General instructions   Take over-the-counter and prescription medicines only as told by your health care provider.  Do not take baths, swim, or use a hot tub until your health care provider approves. Ask your health care provider if you may take showers. You may only be allowed to take sponge baths.  If directed, put ice on the affected area. To do this: ? Put ice in a plastic bag. ? Place a towel between your skin and the bag. ? Leave the ice on for 20 minutes, 2-3 times a day.  Keep all follow-up visits as told by your health care provider. This is important. Contact a  health care provider if:  Your pain is not controlled with medicine.  You have a fever.  You have more redness, swelling, or pain around the puncture site.  You have fluid or blood coming from the puncture site.  Your puncture site feels warm to the touch.  You have pus or a bad smell coming from the puncture site. Summary  After the procedure, it is common to have mild pain, tenderness, swelling, and bruising.  Follow instructions from your health care provider about how to take care of the puncture site and what activities are safe for you.  Take over-the-counter and prescription medicines only as told by your health care provider.  Contact a health care provider if you have any signs of infection, such as fluid or blood coming from the puncture site. This information is not intended to replace advice given to you by your health care provider. Make sure you discuss any questions you have with your health care provider. Document Revised: 03/02/2019 Document Reviewed: 03/02/2019 Elsevier Patient Education  [place]. Moderate Conscious Sedation, Adult, Care After These instructions provide you with information about caring for yourself after your procedure. Your health care provider may also give you more specific instructions. Your treatment has been planned according to current medical practices, but problems sometimes occur. Call your health care provider if you have any problems or questions after your procedure. What can I expect after the procedure? After your procedure,  it is common:  To feel sleepy for several hours.  To feel clumsy and have poor balance for several hours.  To have poor judgment for several hours.  To vomit if you eat too soon. Follow these instructions at home: For at least 24 hours after the procedure:   Do not: ? Participate in activities where you could fall or become injured. ? Drive. ? Use heavy machinery. ? Drink alcohol. ? Take  sleeping pills or medicines that cause drowsiness. ? Make important decisions or sign legal documents. ? Take care of children on your own.  Rest. Eating and drinking  Follow the diet recommended by your health care provider.  If you vomit: ? Drink water, juice, or soup when you can drink without vomiting. ? Make sure you have little or no nausea before eating solid foods. General instructions  Have a responsible adult stay with you until you are awake and alert.  Take over-the-counter and prescription medicines only as told by your health care provider.  If you smoke, do not smoke without supervision.  Keep all follow-up visits as told by your health care provider. This is important. Contact a health care provider if:  You keep feeling nauseous or you keep vomiting.  You feel light-headed.  You develop a rash.  You have a fever. Get help right away if:  You have trouble breathing. This information is not intended to replace advice given to you by your health care provider. Make sure you discuss any questions you have with your health care provider. Document Revised: 09/26/2017 Document Reviewed: 02/03/2016 Elsevier Patient Education  2020 [employer].

## 2020-01-27 LAB — SURGICAL PATHOLOGY

## 2020-01-31 ENCOUNTER — Other Ambulatory Visit: Payer: Self-pay

## 2020-01-31 ENCOUNTER — Inpatient Hospital Stay: Payer: Medicare Other

## 2020-01-31 ENCOUNTER — Inpatient Hospital Stay: Payer: Medicare Other | Attending: Oncology | Admitting: Oncology

## 2020-01-31 DIAGNOSIS — C678 Malignant neoplasm of overlapping sites of bladder: Secondary | ICD-10-CM | POA: Diagnosis not present

## 2020-01-31 DIAGNOSIS — D631 Anemia in chronic kidney disease: Secondary | ICD-10-CM | POA: Diagnosis not present

## 2020-01-31 DIAGNOSIS — Z79899 Other long term (current) drug therapy: Secondary | ICD-10-CM | POA: Diagnosis not present

## 2020-01-31 DIAGNOSIS — D649 Anemia, unspecified: Secondary | ICD-10-CM

## 2020-01-31 DIAGNOSIS — D469 Myelodysplastic syndrome, unspecified: Secondary | ICD-10-CM | POA: Diagnosis present

## 2020-01-31 DIAGNOSIS — N183 Chronic kidney disease, stage 3 unspecified: Secondary | ICD-10-CM

## 2020-01-31 DIAGNOSIS — R600 Localized edema: Secondary | ICD-10-CM | POA: Insufficient documentation

## 2020-01-31 DIAGNOSIS — N189 Chronic kidney disease, unspecified: Secondary | ICD-10-CM | POA: Insufficient documentation

## 2020-01-31 DIAGNOSIS — Z7901 Long term (current) use of anticoagulants: Secondary | ICD-10-CM | POA: Diagnosis not present

## 2020-01-31 DIAGNOSIS — R5383 Other fatigue: Secondary | ICD-10-CM | POA: Diagnosis not present

## 2020-01-31 LAB — CBC WITH DIFFERENTIAL (CANCER CENTER ONLY)
Abs Immature Granulocytes: 0.46 10*3/uL — ABNORMAL HIGH (ref 0.00–0.07)
Basophils Absolute: 0 10*3/uL (ref 0.0–0.1)
Basophils Relative: 0 %
Eosinophils Absolute: 0 10*3/uL (ref 0.0–0.5)
Eosinophils Relative: 0 %
HCT: 23.5 % — ABNORMAL LOW (ref 39.0–52.0)
Hemoglobin: 7.7 g/dL — ABNORMAL LOW (ref 13.0–17.0)
Immature Granulocytes: 5 %
Lymphocytes Relative: 11 %
Lymphs Abs: 1 10*3/uL (ref 0.7–4.0)
MCH: 33.6 pg (ref 26.0–34.0)
MCHC: 32.8 g/dL (ref 30.0–36.0)
MCV: 102.6 fL — ABNORMAL HIGH (ref 80.0–100.0)
Monocytes Absolute: 0.5 10*3/uL (ref 0.1–1.0)
Monocytes Relative: 6 %
Neutro Abs: 6.8 10*3/uL (ref 1.7–7.7)
Neutrophils Relative %: 78 %
Platelet Count: 67 10*3/uL — ABNORMAL LOW (ref 150–400)
RBC: 2.29 MIL/uL — ABNORMAL LOW (ref 4.22–5.81)
RDW: 15 % (ref 11.5–15.5)
WBC Count: 8.8 10*3/uL (ref 4.0–10.5)
WBC Morphology: 29
nRBC: 0 % (ref 0.0–0.2)

## 2020-01-31 LAB — CMP (CANCER CENTER ONLY)
ALT: 32 U/L (ref 0–44)
AST: 23 U/L (ref 15–41)
Albumin: 2.7 g/dL — ABNORMAL LOW (ref 3.5–5.0)
Alkaline Phosphatase: 124 U/L (ref 38–126)
Anion gap: 9 (ref 5–15)
BUN: 27 mg/dL — ABNORMAL HIGH (ref 8–23)
CO2: 24 mmol/L (ref 22–32)
Calcium: 8.7 mg/dL — ABNORMAL LOW (ref 8.9–10.3)
Chloride: 106 mmol/L (ref 98–111)
Creatinine: 1.22 mg/dL (ref 0.61–1.24)
GFR, Est AFR Am: 59 mL/min — ABNORMAL LOW (ref >60)
GFR, Estimated: 51 mL/min — ABNORMAL LOW (ref >60)
Glucose, Bld: 132 mg/dL — ABNORMAL HIGH (ref 70–99)
Potassium: 4.2 mmol/L (ref 3.5–5.1)
Sodium: 139 mmol/L (ref 135–145)
Total Bilirubin: 0.5 mg/dL (ref 0.3–1.2)
Total Protein: 6.9 g/dL (ref 6.5–8.1)

## 2020-01-31 LAB — SAMPLE TO BLOOD BANK

## 2020-01-31 LAB — PREPARE RBC (CROSSMATCH)

## 2020-01-31 NOTE — Progress Notes (Signed)
Hematology and Oncology Follow   Terry Sullivan [id number] [birth year] 86 y.o. 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  He presented with anemia increased blasts of 7%.  Secondary diagnosis: Bladder cancer diagnosed in May 2020.  He was found to have T4N0 high-grade urothelial carcinoma.    Prior Therapy:  He is status post percutaneous biopsy of his bladder mass obtained on Mar 18, 2019 which confirmed the presence of urothelial carcinoma.  Definitive therapy with radiation as well as weekly carboplatin started on 04/01/2019.  He completed therapy on May 20, 2019.  He status post bone marrow biopsy on January 24, 2020.   Current therapy:  Supportive transfusion.  Under consideration to start growth factor support   Interim History: Terry Sullivan returns today for a follow-up visit.  Since the last visit, he reports no major changes in his health.  He is reporting more fatigue tiredness and decline in his performance status.  Denies any recent hospitalization or illnesses.  He does report some mild lower extremity edema.  Denies any recurrent infections or hospitalization.   Medications: Updated on review. Current Outpatient Medications  Medication Sig Dispense Refill  . acetaminophen (TYLENOL) 500 MG tablet Take 500 mg by mouth 2 (two) times daily.    Marland Kitchen amiodarone (PACERONE) 200 MG tablet Take 1 tablet (200 mg total) by mouth daily. 30 tablet 3  . apixaban (ELIQUIS) 2.5 MG TABS tablet Take 1 tablet (2.5 mg total) by mouth 2 (two) times daily. 180 tablet 2  . cetirizine (ZYRTEC) 10 MG tablet Take by mouth as needed.    . doxazosin (CARDURA) 8 MG tablet Take 4 mg by mouth daily.    . fluticasone (FLONASE) 50 MCG/ACT nasal spray INSTILL 2 SPRAYS INTO EACH NOSTRIL ONCE DAILY IF NEEDED    . furosemide (LASIX) 20 MG tablet Take 1 tablet (20 mg total) by mouth daily. 30 tablet 0  . gabapentin  (NEURONTIN) 100 MG capsule Take 100 mg by mouth 2 (two) times daily.     . megestrol (MEGACE) 400 MG/10ML suspension Take 10 mLs (400 mg total) by mouth 2 (two) times daily. 240 mL 0  . mirabegron ER (MYRBETRIQ) 25 MG TB24 tablet Take 25 mg by mouth daily.    . OXYCONTIN 10 MG 12 hr tablet Take 1 tablet by mouth 2 (two) times daily.     No current facility-administered medications for this visit.     Allergies: No Known Allergies  Past Medical History, Surgical history, Social history, and Family History are unchanged on history review.  Physical exam:  Blood pressure 99/67, pulse 64, temperature 98.2 F (36.8 C), temperature source Temporal, resp. rate 18, height 5' 7"  (1.702 m), weight 161 lb 8 oz (73.3 kg), SpO2 98 %.  ECOG 2   General appearance: Alert, awake without any distress. Head: Atraumatic without abnormalities Oropharynx: Without any thrush or ulcers. Eyes: No scleral icterus. Lymph nodes: No lymphadenopathy noted in the cervical, supraclavicular, or axillary nodes Heart:regular rate and rhythm, without any murmurs or gallops.    Bilateral ankle edema. Lung: Clear to auscultation without any rhonchi, wheezes or dullness to percussion. Abdomin: Soft, nontender without any shifting dullness or ascites. Musculoskeletal: No clubbing or cyanosis. Neurological: No motor or sensory deficits. Skin: Mild erythema noted on his right lower extremity.        Lab Results: Lab Results  Component Value Date   WBC  8.8 01/31/2020   HGB 7.7 (L) 01/31/2020   HCT 23.5 (L) 01/31/2020   MCV 102.6 (H) 01/31/2020   PLT 67 (L) 01/31/2020     Chemistry      Component Value Date/Time   NA 141 01/13/2020 0813   K 4.0 01/13/2020 0813   CL 103 01/13/2020 0813   CO2 25 01/13/2020 0813   BUN 28 (H) 01/13/2020 0813   CREATININE 1.34 (H) 01/13/2020 0813      Component Value Date/Time   CALCIUM 8.6 (L) 01/13/2020 0813   ALKPHOS 199 (H) 01/13/2020 0813   AST 23 01/13/2020 0813    ALT 28 01/13/2020 0813   BILITOT 0.6 01/13/2020 0813         Impression and Plan:  86 year old man with:     1.  Anemia appears to be multifactorial in nature related to MDS and chronic renal insufficiency.    Bone marrow biopsy obtained on January 24, 2020 was personally reviewed and discussed today with the patient and his daughter Juliann Pulse via phone.  The bone marrow biopsy was also reviewed with Dr. Gari Crown the reviewing pathologist.  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  He does have increased blast count which is concerning for potential transformation.  The natural course of this disease was reviewed today.  And treatment options are limited at this time.  Given his overall health and age supportive measures really is the best approach at this time.  He is really not a candidate for any aggressive measures and his prognosis is overall poor.  I recommended continued supportive transfusion and initiating Aranesp given his renal insufficiency as well which can help boost his Red cell production.  He is agreeable to proceed.   2.  Bladder cancer diagnosed in May 2020.  He was found to have T4N0 high-grade urothelial carcinoma.  He is currently on active surveillance after completing definitive therapy outlined above.     3.  Thrombocytopenia: No active bleeding noted at this time.  Platelet count of 67,000 without any need for transfusion.   4.  Prognosis and goals of care: He has an incurable disease with potential transformation into a lethal leukemia.  Overall prognosis is poor with limited life expectancy potentially.  I advised him to make appropriate arrangements in terms of living situation as well as end-of-life planning given these findings.   5. Follow-up:  Will be the next 24 hours for packed red cell transfusion and in the following week for Aranesp injection.  30  minutes were spent on this  encounter.  The time was dedicated to reviewing pathology reports, reviewing laboratory data, discussing treatment options and overall prognosis.    [name], MD 01/31/2020 10:11 AM

## 2020-02-01 ENCOUNTER — Inpatient Hospital Stay: Payer: Medicare Other

## 2020-02-01 ENCOUNTER — Other Ambulatory Visit: Payer: Self-pay

## 2020-02-01 ENCOUNTER — Encounter (HOSPITAL_COMMUNITY): Payer: Self-pay | Admitting: Oncology

## 2020-02-01 DIAGNOSIS — D649 Anemia, unspecified: Secondary | ICD-10-CM

## 2020-02-01 DIAGNOSIS — D469 Myelodysplastic syndrome, unspecified: Secondary | ICD-10-CM | POA: Diagnosis not present

## 2020-02-01 LAB — KAPPA/LAMBDA LIGHT CHAINS
Kappa free light chain: 61.3 mg/L — ABNORMAL HIGH (ref 3.3–19.4)
Kappa, lambda light chain ratio: 0.99 (ref 0.26–1.65)
Lambda free light chains: 61.8 mg/L — ABNORMAL HIGH (ref 5.7–26.3)

## 2020-02-01 MED ORDER — DIPHENHYDRAMINE HCL 25 MG PO CAPS
ORAL_CAPSULE | ORAL | Status: AC
  Filled 2020-02-01: qty 2

## 2020-02-01 MED ORDER — ACETAMINOPHEN 325 MG PO TABS
ORAL_TABLET | ORAL | Status: AC
  Filled 2020-02-01: qty 2

## 2020-02-01 MED ORDER — DIPHENHYDRAMINE HCL 25 MG PO CAPS
25.0000 mg | ORAL_CAPSULE | Freq: Once | ORAL | Status: AC
  Administered 2020-02-01: 12:00:00 25 mg via ORAL

## 2020-02-01 MED ORDER — SODIUM CHLORIDE 0.9% IV SOLUTION
250.0000 mL | Freq: Once | INTRAVENOUS | Status: AC
  Administered 2020-02-01: 250 mL via INTRAVENOUS
  Filled 2020-02-01: qty 250

## 2020-02-01 MED ORDER — ACETAMINOPHEN 325 MG PO TABS
650.0000 mg | ORAL_TABLET | Freq: Once | ORAL | Status: AC
  Administered 2020-02-01: 650 mg via ORAL

## 2020-02-01 NOTE — Patient Instructions (Signed)

## 2020-02-02 ENCOUNTER — Telehealth: Payer: Self-pay | Admitting: Oncology

## 2020-02-02 LAB — BPAM RBC
Blood Product Expiration Date: 202104212359
Blood Product Expiration Date: 202104262359
ISSUE DATE / TIME: 202104061146
ISSUE DATE / TIME: 202104061146
Unit Type and Rh: 9500
Unit Type and Rh: 9500

## 2020-02-02 LAB — TYPE AND SCREEN
ABO/RH(D): O NEG
Antibody Screen: NEGATIVE
Unit division: 0
Unit division: 0

## 2020-02-02 NOTE — Telephone Encounter (Signed)
Scheduled per 4/5 los. Called and spoke with Izora Gala (daughter), confirmed 4/13, 5/4, and 5/26 appts

## 2020-02-03 ENCOUNTER — Other Ambulatory Visit: Payer: Self-pay

## 2020-02-03 ENCOUNTER — Encounter: Payer: Self-pay | Admitting: Cardiology

## 2020-02-03 ENCOUNTER — Ambulatory Visit: Payer: Medicare Other | Admitting: Cardiology

## 2020-02-03 VITALS — BP 119/50 | HR 54 | Temp 89.3°F | Ht 67.0 in | Wt 160.0 lb

## 2020-02-03 DIAGNOSIS — D696 Thrombocytopenia, unspecified: Secondary | ICD-10-CM

## 2020-02-03 DIAGNOSIS — R609 Edema, unspecified: Secondary | ICD-10-CM | POA: Insufficient documentation

## 2020-02-03 DIAGNOSIS — D649 Anemia, unspecified: Secondary | ICD-10-CM

## 2020-02-03 DIAGNOSIS — I480 Paroxysmal atrial fibrillation: Secondary | ICD-10-CM

## 2020-02-03 DIAGNOSIS — I340 Nonrheumatic mitral (valve) insufficiency: Secondary | ICD-10-CM

## 2020-02-03 DIAGNOSIS — Z7189 Other specified counseling: Secondary | ICD-10-CM

## 2020-02-03 DIAGNOSIS — R600 Localized edema: Secondary | ICD-10-CM | POA: Insufficient documentation

## 2020-02-03 LAB — MULTIPLE MYELOMA PANEL, SERUM
Albumin SerPl Elph-Mcnc: 2.9 g/dL (ref 2.9–4.4)
Albumin/Glob SerPl: 0.9 (ref 0.7–1.7)
Alpha 1: 0.4 g/dL (ref 0.0–0.4)
Alpha2 Glob SerPl Elph-Mcnc: 1.3 g/dL — ABNORMAL HIGH (ref 0.4–1.0)
B-Globulin SerPl Elph-Mcnc: 0.9 g/dL (ref 0.7–1.3)
Gamma Glob SerPl Elph-Mcnc: 0.8 g/dL (ref 0.4–1.8)
Globulin, Total: 3.3 g/dL (ref 2.2–3.9)
IgA: 146 mg/dL (ref 61–437)
IgG (Immunoglobin G), Serum: 1011 mg/dL (ref 603–1613)
IgM (Immunoglobulin M), Srm: 94 mg/dL (ref 15–143)
Total Protein ELP: 6.2 g/dL (ref 6.0–8.5)

## 2020-02-03 MED ORDER — AMIODARONE HCL 100 MG PO TABS: 100.0000 mg | ORAL_TABLET | ORAL | 2 refills | Status: AC

## 2020-02-03 MED ORDER — FUROSEMIDE 40 MG PO TABS: 40.0000 mg | ORAL_TABLET | Freq: Every day | ORAL | 2 refills | Status: AC

## 2020-02-03 NOTE — Progress Notes (Signed)
Follow up visit  Subjective:   Terry Sullivan, male    DOB: 1924/05/22, 86 y.o.   MRN: [id number]   HPI  Chief Complaint  Patient presents with  . Hospitalization Follow-up    needs refills  . Atrial Fibrillation    86 y.o.Cacuasianmalewith PAF, mitral and tricuspid regurgitation, h/ohigh-grade urothelial carcinoma s/p chemoradiation, anemia, thrombocytopenia  admitted with palpitations, shortness of breath.  Patient sees Dr. [name] at cancer center for his urothelial carcinoma and hematological dyscrasias. He underwent definitive therapy with radiation as well as weekly carboplatin thwrapy in June-July 2020. He underwent bone marrow biopasy in March 2021, which showed anemia was multifactorial in nature related to MDS and chronic renal insufficiency. Given his overall health and age, poor prognosis, he was deemed not to be a candidate for any aggressive measures. Supportive care with transfusions and Arancsep was recommedned.   He has noticed swelling in both his legs. Patient tells me that he has had mild redness on his left shin for at least 3-4 weeks without any change. He has generalized fatigue and low energy levels. He denies any palpitations. His physical activity is limited without any overt limiting chest pain, shortness of breath.   He is currently on eliquis 2.5 mg bid. He does not have any significant bleeding issues, in spite of anemia and thrombocytopenia.    Current Outpatient Medications on File Prior to Visit  Medication Sig Dispense Refill  . acetaminophen (TYLENOL) 500 MG tablet Take 500 mg by mouth 2 (two) times daily.    Marland Kitchen amiodarone (PACERONE) 200 MG tablet Take 1 tablet (200 mg total) by mouth daily. 30 tablet 3  . apixaban (ELIQUIS) 2.5 MG TABS tablet Take 1 tablet (2.5 mg total) by mouth 2 (two) times daily. 180 tablet 2  . cetirizine (ZYRTEC) 10 MG tablet Take by mouth as needed.    . doxazosin (CARDURA) 8 MG tablet Take 4 mg by mouth daily.    .  fluticasone (FLONASE) 50 MCG/ACT nasal spray INSTILL 2 SPRAYS INTO EACH NOSTRIL ONCE DAILY IF NEEDED    . furosemide (LASIX) 20 MG tablet Take 1 tablet (20 mg total) by mouth daily. 30 tablet 0  . gabapentin (NEURONTIN) 100 MG capsule Take 100 mg by mouth 2 (two) times daily.     . megestrol (MEGACE) 400 MG/10ML suspension Take 10 mLs (400 mg total) by mouth 2 (two) times daily. 240 mL 0  . mirabegron ER (MYRBETRIQ) 25 MG TB24 tablet Take 25 mg by mouth daily.    . OXYCONTIN 10 MG 12 hr tablet Take 1 tablet by mouth 2 (two) times daily.     No current facility-administered medications on file prior to visit.    Cardiovascular & other pertient studies:  EKG 02/03/2020: Junctional rhyhtm 53 bpm. RBBB. LAFB.  EKG3/04/2020: Afib w/RVR 125 bpm. LAFB. RBBB  Telemetry 01/03/2020: Sinus rhythm  Echocardiogram 01/03/2020: 1. Left ventricular ejection fraction, by estimation, is 55 to 60%. The  left ventricle has normal function. The left ventricle has no regional  wall motion abnormalities. Inadequate Doppler data to assess diastolic  function.  2. Right ventricular systolic function is low normal. The right  ventricular size is normal. There is moderately elevated pulmonary artery  systolic pressure.  3. Trileaflet aortic valve with moderate sclerosis without significant  stenosis.  4. The mitral valve is grossly normal. Mild to moderate mitral valve  regurgitation.  5. Tricuspid valve regurgitation is mild to moderate.  6. Moderate pulmonary hypertension. Estimated PASP  58 mmHg.   Recent labs: 01/31/2020: Glucose 132, BUN/Cr 27/1.22. EGFR 51. Na/K 139/4.2. Albumin 2.7. Rest of the CMP normal H/H 7.7/23.5. MCV 102.6. Platelets 67  01/13/2020: Glucose 201, BUN/Cr 128/1.34. EGFR 53. Na/K 141/4.0. Albumin 2.5. Rest of the CMP normal H/H 9.1/28.5. MCV 104. Platelets 103 TIBC 157 low. Iron sat 44% high. Ferritin 1243 high. Folate 20.5 normal. Vit B12 1429 high   Review of Systems    Cardiovascular: Positive for leg swelling. Negative for chest pain, dyspnea on exertion, palpitations and syncope.  Hematologic/Lymphatic: Negative for bleeding problem. Does not bruise/bleed easily.         Vitals:   02/03/20 1052  BP: (!) 119/50  Pulse: (!) 54  Temp: (!) 89.3 F (31.8 C)  SpO2: 95%     Body mass index is 25.06 kg/m. Filed Weights   02/03/20 1052  Weight: 160 lb (72.6 kg)     Objective:   Physical Exam  Constitutional: He appears well-developed and well-nourished.  Neck: No JVD present.  Cardiovascular: Normal rate, regular rhythm and intact distal pulses.  Murmur heard. High-pitched blowing holosystolic murmur is present with a grade of 3/6 at the apex. Pulmonary/Chest: Effort normal and breath sounds normal. He has no wheezes. He has no rales.  Musculoskeletal:        General: Edema (2+ b/l) present.  Nursing note and vitals reviewed.      Assessment & Recommendations:   86 y.o.Cacuasianmalewith PAF, mitral and tricuspid regurgitation, h/ohigh-grade urothelial carcinoma s/p chemoradiation, anemia, thrombocytopenia  Leg edema: Secondary to moderate pulmonary hypertension. Recommend conservative management. Increase lasix to 40 mg daily. Given advanced age, other comorbidities, he is not a candidate for any further aggressive management.   PAF: Afib RVR episode in 11/2019. Currently, he is in junctional rhythm in 13s. E likely has sinus node dysfunction without any syncope. No indication for pacemaker. Decrease amiodarone to 100 mg every other day, in order to avoid episodes of RVR, without causing overt symptomatic bradycardia.   CHA2DS2VASc score 2. Annual stroke risk 2.2%. In absence of acute bleeding, reasonable to continue 2.5 mg bid.   Advanced care planning: Patient unequivocally expressed his wishes to be DNR. Daughter Terry Sullivan present. In fact, he tells me that he is going to sign advanced directive paperwork through his PCP Dr.  Delene Ruffini office.   Terry Mormon, MD [facility] [place] Pager: [phone] Office: [phone]

## 2020-02-04 ENCOUNTER — Encounter (HOSPITAL_COMMUNITY): Payer: Self-pay | Admitting: Oncology

## 2020-02-08 ENCOUNTER — Other Ambulatory Visit: Payer: Self-pay

## 2020-02-08 ENCOUNTER — Inpatient Hospital Stay: Payer: Medicare Other

## 2020-02-08 VITALS — BP 115/56 | HR 55 | Temp 98.0°F | Resp 16

## 2020-02-08 DIAGNOSIS — D469 Myelodysplastic syndrome, unspecified: Secondary | ICD-10-CM | POA: Diagnosis not present

## 2020-02-08 DIAGNOSIS — C678 Malignant neoplasm of overlapping sites of bladder: Secondary | ICD-10-CM

## 2020-02-08 DIAGNOSIS — D631 Anemia in chronic kidney disease: Secondary | ICD-10-CM

## 2020-02-08 DIAGNOSIS — D649 Anemia, unspecified: Secondary | ICD-10-CM

## 2020-02-08 LAB — CBC WITH DIFFERENTIAL (CANCER CENTER ONLY)
Abs Immature Granulocytes: 0.4 10*3/uL — ABNORMAL HIGH (ref 0.00–0.07)
Band Neutrophils: 1 %
Basophils Absolute: 0 10*3/uL (ref 0.0–0.1)
Basophils Relative: 0 %
Blasts: 2 %
Eosinophils Absolute: 0.2 10*3/uL (ref 0.0–0.5)
Eosinophils Relative: 2 %
HCT: 29.2 % — ABNORMAL LOW (ref 39.0–52.0)
Hemoglobin: 9.7 g/dL — ABNORMAL LOW (ref 13.0–17.0)
Lymphocytes Relative: 11 %
Lymphs Abs: 1.2 10*3/uL (ref 0.7–4.0)
MCH: 32.4 pg (ref 26.0–34.0)
MCHC: 33.2 g/dL (ref 30.0–36.0)
MCV: 97.7 fL (ref 80.0–100.0)
Metamyelocytes Relative: 3 %
Monocytes Absolute: 0.1 10*3/uL (ref 0.1–1.0)
Monocytes Relative: 1 %
Myelocytes: 1 %
Neutro Abs: 8.6 10*3/uL — ABNORMAL HIGH (ref 1.7–7.7)
Neutrophils Relative %: 79 %
Platelet Count: 50 10*3/uL — ABNORMAL LOW (ref 150–400)
RBC: 2.99 MIL/uL — ABNORMAL LOW (ref 4.22–5.81)
RDW: 15.2 % (ref 11.5–15.5)
WBC Count: 10.8 10*3/uL — ABNORMAL HIGH (ref 4.0–10.5)
nRBC: 0 % (ref 0.0–0.2)

## 2020-02-08 LAB — SAMPLE TO BLOOD BANK

## 2020-02-08 MED ORDER — DARBEPOETIN ALFA 300 MCG/0.6ML IJ SOSY
300.0000 ug | PREFILLED_SYRINGE | Freq: Once | INTRAMUSCULAR | Status: AC
  Administered 2020-02-08: 11:00:00 300 ug via SUBCUTANEOUS

## 2020-02-08 MED ORDER — DARBEPOETIN ALFA 300 MCG/0.6ML IJ SOSY
PREFILLED_SYRINGE | INTRAMUSCULAR | Status: AC
  Filled 2020-02-08: qty 0.6

## 2020-02-20 ENCOUNTER — Encounter (HOSPITAL_COMMUNITY): Payer: Self-pay | Admitting: Emergency Medicine

## 2020-02-20 ENCOUNTER — Emergency Department (HOSPITAL_COMMUNITY)
Admission: EM | Admit: 2020-02-20 | Discharge: 2020-02-20 | Disposition: A | Discharge status: Home / Self Care | Payer: Medicare Other | Attending: Emergency Medicine | Admitting: Emergency Medicine

## 2020-02-20 ENCOUNTER — Emergency Department (HOSPITAL_COMMUNITY): Payer: Medicare Other

## 2020-02-20 ENCOUNTER — Other Ambulatory Visit: Payer: Self-pay

## 2020-02-20 DIAGNOSIS — N183 Chronic kidney disease, stage 3 unspecified: Secondary | ICD-10-CM | POA: Insufficient documentation

## 2020-02-20 DIAGNOSIS — K5900 Constipation, unspecified: Secondary | ICD-10-CM | POA: Diagnosis not present

## 2020-02-20 DIAGNOSIS — Z79899 Other long term (current) drug therapy: Secondary | ICD-10-CM | POA: Diagnosis not present

## 2020-02-20 DIAGNOSIS — I4891 Unspecified atrial fibrillation: Secondary | ICD-10-CM | POA: Diagnosis not present

## 2020-02-20 DIAGNOSIS — R1084 Generalized abdominal pain: Secondary | ICD-10-CM

## 2020-02-20 DIAGNOSIS — D72829 Elevated white blood cell count, unspecified: Secondary | ICD-10-CM | POA: Diagnosis not present

## 2020-02-20 DIAGNOSIS — Z7901 Long term (current) use of anticoagulants: Secondary | ICD-10-CM | POA: Insufficient documentation

## 2020-02-20 DIAGNOSIS — Z8551 Personal history of malignant neoplasm of bladder: Secondary | ICD-10-CM | POA: Diagnosis not present

## 2020-02-20 LAB — CBC WITH DIFFERENTIAL/PLATELET
Abs Immature Granulocytes: 3.33 10*3/uL — ABNORMAL HIGH (ref 0.00–0.07)
Basophils Absolute: 0.2 10*3/uL — ABNORMAL HIGH (ref 0.0–0.1)
Basophils Relative: 0 %
Eosinophils Absolute: 0 10*3/uL (ref 0.0–0.5)
Eosinophils Relative: 0 %
HCT: 22.2 % — ABNORMAL LOW (ref 39.0–52.0)
Hemoglobin: 7.5 g/dL — ABNORMAL LOW (ref 13.0–17.0)
Immature Granulocytes: 8 %
Lymphocytes Relative: 11 %
Lymphs Abs: 4.4 10*3/uL — ABNORMAL HIGH (ref 0.7–4.0)
MCH: 32.1 pg (ref 26.0–34.0)
MCHC: 33.8 g/dL (ref 30.0–36.0)
MCV: 94.9 fL (ref 80.0–100.0)
Monocytes Absolute: 7.4 10*3/uL — ABNORMAL HIGH (ref 0.1–1.0)
Monocytes Relative: 18 %
Neutro Abs: 24.8 10*3/uL — ABNORMAL HIGH (ref 1.7–7.7)
Neutrophils Relative %: 63 %
Platelets: 46 10*3/uL — ABNORMAL LOW (ref 150–400)
RBC: 2.34 MIL/uL — ABNORMAL LOW (ref 4.22–5.81)
RDW: 15.3 % (ref 11.5–15.5)
WBC: 40.1 10*3/uL — ABNORMAL HIGH (ref 4.0–10.5)
nRBC: 0 % (ref 0.0–0.2)

## 2020-02-20 LAB — LIPASE, BLOOD: Lipase: 19 U/L (ref 11–51)

## 2020-02-20 LAB — COMPREHENSIVE METABOLIC PANEL
ALT: 21 U/L (ref 0–44)
AST: 25 U/L (ref 15–41)
Albumin: 3.3 g/dL — ABNORMAL LOW (ref 3.5–5.0)
Alkaline Phosphatase: 100 U/L (ref 38–126)
Anion gap: 13 (ref 5–15)
BUN: 28 mg/dL — ABNORMAL HIGH (ref 8–23)
CO2: 25 mmol/L (ref 22–32)
Calcium: 9.1 mg/dL (ref 8.9–10.3)
Chloride: 101 mmol/L (ref 98–111)
Creatinine, Ser: 1.5 mg/dL — ABNORMAL HIGH (ref 0.61–1.24)
GFR calc Af Amer: 46 mL/min — ABNORMAL LOW (ref >60)
GFR calc non Af Amer: 40 mL/min — ABNORMAL LOW (ref >60)
Glucose, Bld: 136 mg/dL — ABNORMAL HIGH (ref 70–99)
Potassium: 3.2 mmol/L — ABNORMAL LOW (ref 3.5–5.1)
Sodium: 139 mmol/L (ref 135–145)
Total Bilirubin: 1.3 mg/dL — ABNORMAL HIGH (ref 0.3–1.2)
Total Protein: 6.7 g/dL (ref 6.5–8.1)

## 2020-02-20 LAB — CBC
HCT: 24.1 % — ABNORMAL LOW (ref 39.0–52.0)
Hemoglobin: 8 g/dL — ABNORMAL LOW (ref 13.0–17.0)
MCH: 32 pg (ref 26.0–34.0)
MCHC: 33.2 g/dL (ref 30.0–36.0)
MCV: 96.4 fL (ref 80.0–100.0)
Platelets: 54 10*3/uL — ABNORMAL LOW (ref 150–400)
RBC: 2.5 MIL/uL — ABNORMAL LOW (ref 4.22–5.81)
RDW: 15.5 % (ref 11.5–15.5)
WBC: 43.2 10*3/uL — ABNORMAL HIGH (ref 4.0–10.5)
nRBC: 0.1 % (ref 0.0–0.2)

## 2020-02-20 LAB — LACTIC ACID, PLASMA: Lactic Acid, Venous: 1.1 mmol/L (ref 0.5–1.9)

## 2020-02-20 MED ORDER — GLYCERIN (ADULT) 2 G RE SUPP: 1.0000 | RECTAL | 0 refills | Status: AC | PRN

## 2020-02-20 MED ORDER — SORBITOL 70 % SOLN
960.0000 mL | TOPICAL_OIL | Freq: Once | ORAL | Status: AC
  Administered 2020-02-20: 960 mL via RECTAL
  Filled 2020-02-20 (×2): qty 473

## 2020-02-20 MED ORDER — SODIUM CHLORIDE 0.9% FLUSH: 3.0000 mL | Freq: Once | INTRAVENOUS | Status: DC

## 2020-02-20 MED ORDER — SODIUM CHLORIDE 0.9 % IV BOLUS
500.0000 mL | Freq: Once | INTRAVENOUS | Status: AC
  Administered 2020-02-20: 500 mL via INTRAVENOUS

## 2020-02-20 MED ORDER — IOHEXOL 300 MG/ML  SOLN
75.0000 mL | Freq: Once | INTRAMUSCULAR | Status: AC | PRN
  Administered 2020-02-20: 13:00:00 75 mL via INTRAVENOUS

## 2020-02-20 MED ORDER — OXYCODONE HCL ER 10 MG PO T12A
10.0000 mg | EXTENDED_RELEASE_TABLET | Freq: Once | ORAL | Status: AC
  Administered 2020-02-20: 10 mg via ORAL
  Filled 2020-02-20: qty 1

## 2020-02-20 NOTE — Discharge Instructions (Signed)
Please read and follow all provided instructions.  Your diagnoses today include:  1. Constipation, unspecified constipation type   2. Generalized abdominal pain   3. Leukocytosis, unspecified type     Tests performed today include:  Blood counts and electrolytes - shows elevating white blood cell count  Blood tests to check liver and kidney function  Blood tests to check pancreas function  Urine test to look for infection  CT scan -shows evidence of constipation  Vital signs. See below for your results today.   Medications prescribed:   Colace - stool softener  This medication can be found over-the-counter.   Continue colace for 2 weeks after your stools return to normal to prevent constipation.    Glycerin suppository -you may use this if you develop constipation  Take any prescribed medications only as directed.  Home care instructions:   Follow any educational materials contained in this packet.  Since you are on chronic narcotic medications, it would be helpful if you were to take a daily stool softener to help prevent constipation.  If you develop constipation, use MiraLAX as instructed on the packaging and the glycerin suppositories prescribed.  Maintain good hydration.  Follow-up instructions: Please follow-up with your primary care provider in the next 3 days for further evaluation of your symptoms.    Return instructions:  SEEK IMMEDIATE MEDICAL ATTENTION IF:  The pain does not go away or becomes severe   A temperature above 101F develops   Repeated vomiting occurs (multiple episodes)   The pain becomes localized to portions of the abdomen. The right side could possibly be appendicitis. In an adult, the left lower portion of the abdomen could be colitis or diverticulitis.   Blood is being passed in stools or vomit (bright red or black tarry stools)   You develop chest pain, difficulty breathing, dizziness or fainting, or become confused, poorly  responsive, or inconsolable (young children)  If you have any other emergent concerns regarding your health  Additional Information: Abdominal (belly) pain can be caused by many things. Your caregiver performed an examination and possibly ordered blood/urine tests and imaging (CT scan, x-rays, ultrasound). Many cases can be observed and treated at home after initial evaluation in the emergency department. Even though you are being discharged home, abdominal pain can be unpredictable. Therefore, you need a repeated exam if your pain does not resolve, returns, or worsens. Most patients with abdominal pain don't have to be admitted to the hospital or have surgery, but serious problems like appendicitis and gallbladder attacks can start out as nonspecific pain. Many abdominal conditions cannot be diagnosed in one visit, so follow-up evaluations are very important.  Your vital signs today were: BP 125/76 (BP Location: Right Arm)   Pulse 72   Temp (!) 97.4 F (36.3 C) (Oral)   Resp (!) 23   Ht 5\' 7"  (1.702 m)   Wt 71.7 kg   SpO2 100%   BMI 24.75 kg/m  If your blood pressure (bp) was elevated above 135/85 this visit, please have this repeated by your doctor within one month. --------------

## 2020-02-20 NOTE — ED Triage Notes (Signed)
C/o constipation and generalized abd pain x 1 week.

## 2020-02-20 NOTE — ED Provider Notes (Signed)
[facility] EMERGENCY DEPARTMENT Provider Note   CSN: [id number] Arrival date & time: 02/20/20  [id number]     History Chief Complaint  Patient presents with  . Constipation    Kimm Haislip is a 86 y.o. male.  Patient with history of bladder cancer status post radiation and chemotherapy, no previous abdominal surgeries, myelodysplastic syndrome managed by Dr. [name] diagnosed 12/2019 with increased blasts without signs of leukemia but high risk for transformation given recent note, atrial fibrillation followed by Dr. Virgina Jock currently on Eliquis and amiodarone, chronic back pain on oxycontin -- presents to the emergency department today with complaint of constipation ongoing over the past 9 days with increasing abdominal discomfort.  No nausea or vomiting.  No fevers, chest pain or shortness of breath.  No lower extremity swelling.  Normal urination without dysuria or hematuria.  Patient has received Procrit to help boost his red blood cell count.  Family member at bedside does not remember patient having any increased white blood cell count. The onset of this condition was acute. The course is constant. Aggravating factors: none. Alleviating factors: none.          Past Medical History:  Diagnosis Date  . Atrial fibrillation ([place])   . Bladder cancer ([place]) dx'd 03/2019    Patient Active Problem List   Diagnosis Date Noted  . Thrombocytopenia ([place]) 02/03/2020  . Bilateral leg edema 02/03/2020  . Edema 02/03/2020  . Advanced care planning/counseling discussion 02/03/2020  . Anemia in chronic kidney disease 01/31/2020  . Atrial fibrillation with RVR ([place]) 01/02/2020  . Anemia 01/02/2020  . CKD (chronic kidney disease) stage 3, GFR 30-59 ml/min   . Paroxysmal atrial fibrillation ([place]) 04/10/2019  . Bradycardia 04/10/2019  . Mild mitral regurgitation 04/10/2019  . Bladder cancer ([place]) 03/17/2019  . Primary osteoarthritis of both hands 12/25/2018  . Trigger ring  finger of right hand 12/25/2018    Past Surgical History:  Procedure Laterality Date  . BACK SURGERY         Family History  Problem Relation Age of Onset  . Hodgkin's lymphoma Mother   . Bladder Cancer Father   . Cancer Brother     Social History   Tobacco Use  . Smoking status: Never Smoker  . Smokeless tobacco: Never Used  Substance Use Topics  . Alcohol use: Not Currently  . Drug use: Never    Home Medications Prior to Admission medications   Medication Sig Start Date End Date Taking? Authorizing Provider  acetaminophen (TYLENOL) 500 MG tablet Take 500 mg by mouth 2 (two) times daily.    [provider]  amiodarone (PACERONE) 100 MG tablet Take 1 tablet (100 mg total) by mouth as directed. M,W,F 02/03/20   Patwardhan, Reynold Bowen, MD  apixaban (ELIQUIS) 2.5 MG TABS tablet Take 1 tablet (2.5 mg total) by mouth 2 (two) times daily. 10/07/19   Patwardhan, Reynold Bowen, MD  cetirizine (ZYRTEC) 10 MG tablet Take by mouth as needed.    [provider]  Cholecalciferol (VITAMIN D3 PO) Take by mouth.    [provider]  doxazosin (CARDURA) 8 MG tablet Take 4 mg by mouth daily. 03/10/19   [provider]  fluticasone (FLONASE) 50 MCG/ACT nasal spray INSTILL 2 SPRAYS INTO EACH NOSTRIL ONCE DAILY IF NEEDED 03/24/18   [provider]  furosemide (LASIX) 40 MG tablet Take 1 tablet (40 mg total) by mouth daily. 02/03/20 02/02/21  Patwardhan, Reynold Bowen, MD  gabapentin (NEURONTIN) 100 MG capsule  Take 100 mg by mouth 2 (two) times daily.  02/17/19   [provider]  megestrol (MEGACE) 400 MG/10ML suspension Take 10 mLs (400 mg total) by mouth 2 (two) times daily. 08/18/19   Wyatt Portela, MD  mirabegron ER (MYRBETRIQ) 25 MG TB24 tablet Take 25 mg by mouth daily.    [provider]  Multiple Vitamins-Minerals (ICAPS AREDS 2 PO) Take 1 tablet by mouth daily.    [provider]  OXYCONTIN 10 MG 12 hr tablet Take 1 tablet by mouth 2  (two) times daily. 02/23/19   [provider]    Allergies    Patient has no known allergies.  Review of Systems   Review of Systems  Constitutional: Negative for fever.  HENT: Negative for rhinorrhea and sore throat.   Eyes: Negative for redness.  Respiratory: Negative for cough.   Cardiovascular: Negative for chest pain.  Gastrointestinal: Positive for abdominal distention, abdominal pain and constipation. Negative for blood in stool, diarrhea, nausea and vomiting.  Genitourinary: Negative for dysuria.  Musculoskeletal: Negative for myalgias.  Skin: Negative for rash.  Neurological: Negative for headaches.    Physical Exam Updated Vital Signs BP (!) 147/70 (BP Location: Right Arm)   Pulse 71   Temp (!) 97.4 F (36.3 C) (Oral)   Resp (!) 21   Ht 5\' 7"  (1.702 m)   Wt 71.7 kg   SpO2 99%   BMI 24.75 kg/m   Physical Exam Vitals and nursing note reviewed. Exam conducted with a chaperone present.  Constitutional:      Appearance: He is well-developed.  HENT:     Head: Normocephalic and atraumatic.  Eyes:     General:        Right eye: No discharge.        Left eye: No discharge.     Conjunctiva/sclera: Conjunctivae normal.  Cardiovascular:     Rate and Rhythm: Normal rate and regular rhythm.     Heart sounds: Normal heart sounds.  Pulmonary:     Effort: Pulmonary effort is normal.     Breath sounds: Normal breath sounds.  Abdominal:     General: There is distension (mild).     Palpations: Abdomen is soft.     Tenderness: There is abdominal tenderness (generalized). There is no guarding or rebound.     Comments: Abd soft   Genitourinary:    Rectum: No mass, tenderness or external hemorrhoid.     Comments: Large volume of sticky brown stool in rectal vault.  Musculoskeletal:     Cervical back: Normal range of motion and neck supple.  Skin:    General: Skin is warm and dry.  Neurological:     Mental Status: He is alert.     ED Results / Procedures /  Treatments   Labs (all labs ordered are listed, but only abnormal results are displayed) Labs Reviewed  COMPREHENSIVE METABOLIC PANEL - Abnormal; Notable for the following components:      Result Value   Potassium 3.2 (*)    Glucose, Bld 136 (*)    BUN 28 (*)    Creatinine, Ser 1.50 (*)    Albumin 3.3 (*)    Total Bilirubin 1.3 (*)    GFR calc non Af Amer 40 (*)    GFR calc Af Amer 46 (*)    All other components within normal limits  CBC - Abnormal; Notable for the following components:   WBC 43.2 (*)    RBC 2.50 (*)  Hemoglobin 8.0 (*)    HCT 24.1 (*)    Platelets 54 (*)    All other components within normal limits  CBC WITH DIFFERENTIAL/PLATELET - Abnormal; Notable for the following components:   WBC 40.1 (*)    RBC 2.34 (*)    Hemoglobin 7.5 (*)    HCT 22.2 (*)    Platelets 46 (*)    Neutro Abs 24.8 (*)    Lymphs Abs 4.4 (*)    Monocytes Absolute 7.4 (*)    Basophils Absolute 0.2 (*)    Abs Immature Granulocytes 3.33 (*)    All other components within normal limits  LIPASE, BLOOD  LACTIC ACID, PLASMA  URINALYSIS, ROUTINE W REFLEX MICROSCOPIC  PATHOLOGIST SMEAR REVIEW    EKG None  Radiology CT ABDOMEN PELVIS W CONTRAST  Result Date: 02/20/2020 CLINICAL DATA:  Abdominal distension, bowel obstruction suspected, constipation for 8 days, elevated WBC, history of bladder cancer EXAM: CT ABDOMEN AND PELVIS WITH CONTRAST TECHNIQUE: Multidetector CT imaging of the abdomen and pelvis was performed using the standard protocol following bolus administration of intravenous contrast. CONTRAST:  66mL OMNIPAQUE IOHEXOL 300 MG/ML  SOLN COMPARISON:  09/30/2019 FINDINGS: Lower chest: No acute abnormality. Cardiomegaly. Coronary artery calcifications. Hepatobiliary: No solid liver abnormality is seen. No gallstones, gallbladder wall thickening, or biliary dilatation. Pancreas: Unremarkable. No pancreatic ductal dilatation or surrounding inflammatory changes. Spleen: Normal in size  without significant abnormality. Adrenals/Urinary Tract: Adrenal glands are unremarkable. Kidneys are normal, without renal calculi, solid lesion, or hydronephrosis. Bladder is unremarkable. Stomach/Bowel: Stomach is within normal limits. Small incidental diverticulum of the descending duodenum. The appendix is not clearly visualized. Generally moderate burden of stool throughout the colon with large stool balls in the rectum. Vascular/Lymphatic: Aortic atherosclerosis. No enlarged abdominal or pelvic lymph nodes. Reproductive: No mass or other significant abnormality. Other: No abdominal wall hernia or abnormality. No abdominopelvic ascites. Musculoskeletal: No acute or significant osseous findings. Severe disc degenerative disease and osteophytosis of the lumbar spine. IMPRESSION: 1.  No evidence of bowel obstruction. 2. Large stool balls in the rectum with a generally moderate burden of stool throughout the colon otherwise. 3. No evidence of malignancy in the abdomen or pelvis per reported history of bladder cancer. 4.  Coronary artery disease.  Aortic Atherosclerosis (ICD10-I70.0). Electronically Signed   By: Eddie Candle M.D.   On: 02/20/2020 13:38    Procedures Procedures (including critical care time)  Medications Ordered in ED Medications  sodium chloride flush (NS) 0.9 % injection 3 mL (3 mLs Intravenous Not Given 02/20/20 1248)  sodium chloride 0.9 % bolus 500 mL (0 mLs Intravenous Stopped 02/20/20 1415)  iohexol (OMNIPAQUE) 300 MG/ML solution 75 mL (75 mLs Intravenous Contrast Given 02/20/20 1309)  oxyCODONE (OXYCONTIN) 12 hr tablet 10 mg (10 mg Oral Given 02/20/20 1414)  sorbitol, milk of mag, mineral oil, glycerin (SMOG) enema (960 mLs Rectal Given 02/20/20 1715)    ED Course  I have reviewed the triage vital signs and the nursing notes.  Pertinent labs & imaging results that were available during my care of the patient were reviewed by me and considered in my medical decision making (see  chart for details).  Patient seen and examined. Work-up initiated. Reviewed history in Epic.  White blood cell count is markedly elevated today.  Given abdominal discomfort, will need CT imaging to evaluate for signs of infection.  This may also be related to his blood dyscrasia.  Vital signs reviewed and are as follows: BP (!) 147/70 (BP  Location: Right Arm)   Pulse 71   Temp (!) 97.4 F (36.3 C) (Oral)   Resp (!) 21   Ht 5\' 7"  (1.702 m)   Wt 71.7 kg   SpO2 99%   BMI 24.75 kg/m   1:55 PM CT imaging reviewed. Fortunately no acute findings.  Rectal exam performed with NT chaperone. Vault is full of brown sticky stool. Unable to manually disimpact. Enema ordered. Pt and family agrees.   2:15 PM Spoke with Dr. Irene Limbo regarding elevated WBC. Agrees clinical decision in regards to sepsis. He feels that if we think this could be related to MDS and patient clinically stable for d/c home, patient can follow-up with Dr. [name] for further evaluation.   6:32 PM delay in obtaining enema.  This was eventually administered and with good results.  Patient states he is feeling much better.  On reexam of the abdomen, abdomen is soft and nontender.  I had a good discussion with patient and family at bedside regarding bowel regimen for home.  Also discussed fiber intake, which it sounds like the patient already does a good job of.  He may benefit from maintaining good hydration however.  Patient is due to follow-up with Dr. [name] in early May, will send note informing of increasing white blood cell count.    The patient was urged to return to the Emergency Department immediately with worsening of current symptoms, worsening abdominal pain, persistent vomiting, blood noted in stools, fever, or any other concerns. The patient verbalized understanding.     Clinical Course as of Feb 19 1353  Sun Feb 20, 3251  [id number] 86 year old male here with constipation and generalized abdominal pain for a week.  His white  count is markedly elevated at 43,000 which is new for him.  He is followed with hematology before for some abnormal counts.  CT showing some element of fecal impaction but no abscesses noted.  We will try to disimpact him bowel regimen.  Also need to review with hematology regarding his counts and if he requires admission for further work-up with this.   [MB]    Clinical Course User Index [MB] Hayden Rasmussen, MD   MDM Rules/Calculators/A&P                      Patient presented with abdominal pain and constipation for 8 or 9 days.  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  Patient was evaluated with CT imaging of the abdomen and pelvis to rule out obstruction or other signs of infection causing constipation.  Fortunately this just demonstrated constipation.  Patient was given an enema in the ED with improvement.  His abdomen remains soft and is nontender at time of discharge.  His vital signs are reassuring.  Elevated white blood cell count noted.  Discussed with oncology.  Patient will follow up as outpatient.  Strict return instructions as above.    Final Clinical Impression(s) / ED Diagnoses Final diagnoses:  Constipation, unspecified constipation type  Generalized abdominal pain  Leukocytosis, unspecified type    Rx / DC Orders ED Discharge Orders         Ordered    glycerin adult 2 g suppository  As needed     02/20/20 1853           Carlisle Cater, PA-C 02/20/20 1853    Hayden Rasmussen, MD 02/20/20 2201

## 2020-02-23 LAB — PATHOLOGIST SMEAR REVIEW: Path Review: ELEVATED

## 2020-02-24 ENCOUNTER — Emergency Department (HOSPITAL_COMMUNITY): Payer: Medicare Other

## 2020-02-24 ENCOUNTER — Other Ambulatory Visit: Payer: Self-pay

## 2020-02-24 ENCOUNTER — Encounter: Payer: Self-pay | Admitting: Oncology

## 2020-02-24 ENCOUNTER — Inpatient Hospital Stay (HOSPITAL_COMMUNITY)
Admission: EM | Admit: 2020-02-24 | Discharge: 2020-02-25 | DRG: 812 | Disposition: A | Discharge status: Hospice | Payer: Medicare Other | Attending: Family Medicine | Admitting: Family Medicine

## 2020-02-24 DIAGNOSIS — I480 Paroxysmal atrial fibrillation: Secondary | ICD-10-CM | POA: Diagnosis present

## 2020-02-24 DIAGNOSIS — D696 Thrombocytopenia, unspecified: Secondary | ICD-10-CM | POA: Diagnosis present

## 2020-02-24 DIAGNOSIS — Z7901 Long term (current) use of anticoagulants: Secondary | ICD-10-CM | POA: Diagnosis not present

## 2020-02-24 DIAGNOSIS — M19041 Primary osteoarthritis, right hand: Secondary | ICD-10-CM | POA: Diagnosis present

## 2020-02-24 DIAGNOSIS — Z515 Encounter for palliative care: Secondary | ICD-10-CM | POA: Diagnosis present

## 2020-02-24 DIAGNOSIS — C679 Malignant neoplasm of bladder, unspecified: Secondary | ICD-10-CM | POA: Diagnosis present

## 2020-02-24 DIAGNOSIS — I4581 Long QT syndrome: Secondary | ICD-10-CM | POA: Diagnosis present

## 2020-02-24 DIAGNOSIS — M19042 Primary osteoarthritis, left hand: Secondary | ICD-10-CM | POA: Diagnosis present

## 2020-02-24 DIAGNOSIS — I340 Nonrheumatic mitral (valve) insufficiency: Secondary | ICD-10-CM | POA: Diagnosis present

## 2020-02-24 DIAGNOSIS — I4510 Unspecified right bundle-branch block: Secondary | ICD-10-CM | POA: Diagnosis present

## 2020-02-24 DIAGNOSIS — Z9221 Personal history of antineoplastic chemotherapy: Secondary | ICD-10-CM

## 2020-02-24 DIAGNOSIS — D631 Anemia in chronic kidney disease: Secondary | ICD-10-CM | POA: Diagnosis present

## 2020-02-24 DIAGNOSIS — G8929 Other chronic pain: Secondary | ICD-10-CM | POA: Diagnosis present

## 2020-02-24 DIAGNOSIS — N1832 Chronic kidney disease, stage 3b: Secondary | ICD-10-CM | POA: Diagnosis present

## 2020-02-24 DIAGNOSIS — I252 Old myocardial infarction: Secondary | ICD-10-CM | POA: Diagnosis not present

## 2020-02-24 DIAGNOSIS — N183 Chronic kidney disease, stage 3 unspecified: Secondary | ICD-10-CM | POA: Diagnosis not present

## 2020-02-24 DIAGNOSIS — Z8052 Family history of malignant neoplasm of bladder: Secondary | ICD-10-CM

## 2020-02-24 DIAGNOSIS — Z20822 Contact with and (suspected) exposure to covid-19: Secondary | ICD-10-CM | POA: Diagnosis present

## 2020-02-24 DIAGNOSIS — D469 Myelodysplastic syndrome, unspecified: Secondary | ICD-10-CM | POA: Diagnosis present

## 2020-02-24 DIAGNOSIS — Z923 Personal history of irradiation: Secondary | ICD-10-CM

## 2020-02-24 DIAGNOSIS — Q7649 Other congenital malformations of spine, not associated with scoliosis: Secondary | ICD-10-CM

## 2020-02-24 DIAGNOSIS — C678 Malignant neoplasm of overlapping sites of bladder: Secondary | ICD-10-CM | POA: Diagnosis not present

## 2020-02-24 DIAGNOSIS — Z807 Family history of other malignant neoplasms of lymphoid, hematopoietic and related tissues: Secondary | ICD-10-CM

## 2020-02-24 DIAGNOSIS — M4802 Spinal stenosis, cervical region: Secondary | ICD-10-CM | POA: Diagnosis present

## 2020-02-24 DIAGNOSIS — Z66 Do not resuscitate: Secondary | ICD-10-CM | POA: Diagnosis present

## 2020-02-24 DIAGNOSIS — R531 Weakness: Secondary | ICD-10-CM | POA: Diagnosis present

## 2020-02-24 DIAGNOSIS — Z79899 Other long term (current) drug therapy: Secondary | ICD-10-CM

## 2020-02-24 DIAGNOSIS — R627 Adult failure to thrive: Secondary | ICD-10-CM | POA: Diagnosis present

## 2020-02-24 DIAGNOSIS — Z79891 Long term (current) use of opiate analgesic: Secondary | ICD-10-CM

## 2020-02-24 DIAGNOSIS — D649 Anemia, unspecified: Secondary | ICD-10-CM | POA: Diagnosis present

## 2020-02-24 LAB — CBC WITH DIFFERENTIAL/PLATELET
Abs Immature Granulocytes: 3.08 10*3/uL — ABNORMAL HIGH (ref 0.00–0.07)
Band Neutrophils: 2 %
Basophils Absolute: 0.8 10*3/uL — ABNORMAL HIGH (ref 0.0–0.1)
Basophils Relative: 1 %
Blasts: 2 %
Eosinophils Absolute: 0 10*3/uL (ref 0.0–0.5)
Eosinophils Relative: 0 %
HCT: 22.3 % — ABNORMAL LOW (ref 39.0–52.0)
Hemoglobin: 7.3 g/dL — ABNORMAL LOW (ref 13.0–17.0)
Lymphocytes Relative: 10 %
Lymphs Abs: 7.7 10*3/uL — ABNORMAL HIGH (ref 0.7–4.0)
MCH: 32 pg (ref 26.0–34.0)
MCHC: 32.7 g/dL (ref 30.0–36.0)
MCV: 97.8 fL (ref 80.0–100.0)
Metamyelocytes Relative: 3 %
Monocytes Absolute: 15.4 10*3/uL — ABNORMAL HIGH (ref 0.1–1.0)
Monocytes Relative: 20 %
Myelocytes: 1 %
Neutro Abs: 48.6 10*3/uL — ABNORMAL HIGH (ref 1.7–7.7)
Neutrophils Relative %: 61 %
Other: 0 %
Platelets: 61 10*3/uL — ABNORMAL LOW (ref 150–400)
Promyelocytes Relative: 0 %
RBC: 2.28 MIL/uL — ABNORMAL LOW (ref 4.22–5.81)
RDW: 16 % — ABNORMAL HIGH (ref 11.5–15.5)
WBC: 77.1 10*3/uL (ref 4.0–10.5)
nRBC: 0 /100 WBC
nRBC: 0.1 % (ref 0.0–0.2)

## 2020-02-24 LAB — CBC
HCT: 22 % — ABNORMAL LOW (ref 39.0–52.0)
Hemoglobin: 7.2 g/dL — ABNORMAL LOW (ref 13.0–17.0)
MCH: 32 pg (ref 26.0–34.0)
MCHC: 32.7 g/dL (ref 30.0–36.0)
MCV: 97.8 fL (ref 80.0–100.0)
Platelets: 62 10*3/uL — ABNORMAL LOW (ref 150–400)
RBC: 2.25 MIL/uL — ABNORMAL LOW (ref 4.22–5.81)
RDW: 16 % — ABNORMAL HIGH (ref 11.5–15.5)
WBC: 77.5 10*3/uL (ref 4.0–10.5)
nRBC: 0.1 % (ref 0.0–0.2)

## 2020-02-24 LAB — BASIC METABOLIC PANEL
Anion gap: 13 (ref 5–15)
BUN: 38 mg/dL — ABNORMAL HIGH (ref 8–23)
CO2: 25 mmol/L (ref 22–32)
Calcium: 8.5 mg/dL — ABNORMAL LOW (ref 8.9–10.3)
Chloride: 101 mmol/L (ref 98–111)
Creatinine, Ser: 1.77 mg/dL — ABNORMAL HIGH (ref 0.61–1.24)
GFR calc Af Amer: 38 mL/min — ABNORMAL LOW (ref >60)
GFR calc non Af Amer: 32 mL/min — ABNORMAL LOW (ref >60)
Glucose, Bld: 153 mg/dL — ABNORMAL HIGH (ref 70–99)
Potassium: 3.6 mmol/L (ref 3.5–5.1)
Sodium: 139 mmol/L (ref 135–145)

## 2020-02-24 LAB — TSH: TSH: 1.856 u[IU]/mL (ref 0.350–4.500)

## 2020-02-24 LAB — PREPARE RBC (CROSSMATCH)

## 2020-02-24 LAB — RESPIRATORY PANEL BY RT PCR (FLU A&B, COVID)
Influenza A by PCR: NEGATIVE
Influenza B by PCR: NEGATIVE
SARS Coronavirus 2 by RT PCR: NEGATIVE

## 2020-02-24 MED ORDER — ONDANSETRON HCL 4 MG PO TABS: 4.0000 mg | ORAL_TABLET | Freq: Four times a day (QID) | ORAL | Status: DC | PRN

## 2020-02-24 MED ORDER — SODIUM CHLORIDE 0.9 % IV SOLN
10.0000 mL/h | Freq: Once | INTRAVENOUS | Status: AC
  Administered 2020-02-24: 18:00:00 10 mL/h via INTRAVENOUS

## 2020-02-24 MED ORDER — FUROSEMIDE 40 MG PO TABS
40.0000 mg | ORAL_TABLET | Freq: Every day | ORAL | Status: DC
  Administered 2020-02-24 – 2020-02-25 (×2): 40 mg via ORAL
  Filled 2020-02-24 (×2): qty 1

## 2020-02-24 MED ORDER — GLYCERIN (LAXATIVE) 1 G RE SUPP
1.0000 | RECTAL | Status: DC | PRN
  Filled 2020-02-24: qty 1

## 2020-02-24 MED ORDER — GLYCERIN (LAXATIVE) 2.1 G RE SUPP
1.0000 | RECTAL | Status: DC | PRN
  Filled 2020-02-24: qty 1

## 2020-02-24 MED ORDER — DOCUSATE SODIUM 100 MG PO CAPS
100.0000 mg | ORAL_CAPSULE | Freq: Two times a day (BID) | ORAL | Status: DC
  Administered 2020-02-24 – 2020-02-25 (×2): 100 mg via ORAL
  Filled 2020-02-24 (×2): qty 1

## 2020-02-24 MED ORDER — DOXAZOSIN MESYLATE 4 MG PO TABS
4.0000 mg | ORAL_TABLET | Freq: Every day | ORAL | Status: DC
  Administered 2020-02-25: 09:00:00 4 mg via ORAL
  Filled 2020-02-24: qty 1

## 2020-02-24 MED ORDER — ACETAMINOPHEN 650 MG RE SUPP: 650.0000 mg | Freq: Four times a day (QID) | RECTAL | Status: DC | PRN

## 2020-02-24 MED ORDER — MEGESTROL ACETATE 400 MG/10ML PO SUSP
400.0000 mg | Freq: Two times a day (BID) | ORAL | Status: DC
  Administered 2020-02-25: 400 mg via ORAL
  Filled 2020-02-24 (×2): qty 10

## 2020-02-24 MED ORDER — FLUTICASONE PROPIONATE 50 MCG/ACT NA SUSP: 2.0000 | Freq: Every day | NASAL | Status: DC | PRN

## 2020-02-24 MED ORDER — OXYCODONE HCL ER 10 MG PO T12A
10.0000 mg | EXTENDED_RELEASE_TABLET | Freq: Two times a day (BID) | ORAL | Status: DC
  Administered 2020-02-24 – 2020-02-25 (×2): 10 mg via ORAL
  Filled 2020-02-24 (×2): qty 1

## 2020-02-24 MED ORDER — SENNOSIDES-DOCUSATE SODIUM 8.6-50 MG PO TABS: 1.0000 | ORAL_TABLET | Freq: Every evening | ORAL | Status: DC | PRN

## 2020-02-24 MED ORDER — MIRABEGRON ER 25 MG PO TB24
25.0000 mg | ORAL_TABLET | Freq: Every day | ORAL | Status: DC
  Administered 2020-02-24 – 2020-02-25 (×2): 25 mg via ORAL
  Filled 2020-02-24 (×2): qty 1

## 2020-02-24 MED ORDER — SODIUM CHLORIDE 0.9% FLUSH: 3.0000 mL | Freq: Once | INTRAVENOUS | Status: DC

## 2020-02-24 MED ORDER — BISACODYL 5 MG PO TBEC
5.0000 mg | DELAYED_RELEASE_TABLET | Freq: Every day | ORAL | Status: DC | PRN
  Filled 2020-02-24: qty 1

## 2020-02-24 MED ORDER — GABAPENTIN 100 MG PO CAPS
100.0000 mg | ORAL_CAPSULE | Freq: Two times a day (BID) | ORAL | Status: DC
  Administered 2020-02-24 – 2020-02-25 (×2): 100 mg via ORAL
  Filled 2020-02-24 (×2): qty 1

## 2020-02-24 MED ORDER — AMIODARONE HCL 100 MG PO TABS
100.0000 mg | ORAL_TABLET | ORAL | Status: DC
  Administered 2020-02-25: 09:00:00 100 mg via ORAL
  Filled 2020-02-24: qty 1

## 2020-02-24 MED ORDER — SODIUM CHLORIDE 0.9 % IV SOLN: INTRAVENOUS | Status: AC

## 2020-02-24 MED ORDER — HYDROGEN PEROXIDE 3 % EX SOLN
CUTANEOUS | Status: AC
  Filled 2020-02-24: qty 473

## 2020-02-24 MED ORDER — ONDANSETRON HCL 4 MG/2ML IJ SOLN: 4.0000 mg | Freq: Four times a day (QID) | INTRAMUSCULAR | Status: DC | PRN

## 2020-02-24 MED ORDER — ACETAMINOPHEN 325 MG PO TABS
650.0000 mg | ORAL_TABLET | Freq: Four times a day (QID) | ORAL | Status: DC | PRN
  Administered 2020-02-24 – 2020-02-25 (×2): 650 mg via ORAL
  Filled 2020-02-24 (×2): qty 2

## 2020-02-24 NOTE — ED Notes (Signed)
Patient transported to CT 

## 2020-02-24 NOTE — ED Notes (Signed)
Transfusion finished at handoff on the floor. NAD noted. Pt transferred care to floor RN, debbie.

## 2020-02-24 NOTE — ED Notes (Signed)
Messaged pharmacy to verify 1630 meds

## 2020-02-24 NOTE — ED Triage Notes (Signed)
86 yo male from home with increased generalized weakness since yesterday. Pt was able to get up to bathroom using walker once today but unable to get up after that. +PO intake per EMS. C/O left sided neck pain but sts he slept on it wrong per EMS. Seen here Sunday for constipation. Pt is DNR.

## 2020-02-24 NOTE — ED Notes (Signed)
Attempted to call report. Nurse needs 5 more mins.

## 2020-02-24 NOTE — ED Notes (Addendum)
Paper blood consent obtained and placed in pts records folder

## 2020-02-24 NOTE — ED Notes (Signed)
Called lab to see status on type and screen. Still pending results.

## 2020-02-24 NOTE — ED Notes (Signed)
Pt lying in bed. Water given. Family at bedside. NAD noted. Will continue to monitor.

## 2020-02-24 NOTE — ED Notes (Signed)
Blood bank said they have a unit of blood ready for this patient. Notified Corey Skains, Therapist, sports.

## 2020-02-24 NOTE — ED Notes (Signed)
Date and time results received: 02/24/20 1318  Test: WBC Critical Value: 77.5  Name of Provider Notified: Regenia Skeeter MD

## 2020-02-24 NOTE — H&P (Signed)
History and Physical    Terry Sullivan [id number] DOB: Aug 10, 1924 DOA: 02/24/2020  PCP: [name], MD   Patient coming from: Home  Chief Complaint: Generalized Weakness  HPI: Terry Sullivan is a 86 y.o. male with medical history significant for A. fib on Eliquis, history of bladder cancer status post chemo posterior year, CKD stage IIIb, myelodysplastic syndrome diagnosed in March/2021, thrombocytopenia, anemia, follows with Dr. [name] comes from home with complaint of generalized weakness for a week and progressively worsening. He also complainsof neck pain but attributes to his sleeping position.  Patient was not able to get up today so brought to the ED. He was seen 4 days ago for constipation and  wbc was 43k, and was normal 2 wks ago and sent home.  He denies any chest pain, nausea, vomiting, fever chills. He has mild shortness of breath. He had BM. He fees like not living any more. Daughter at bedside reports they spoke w/ Dr [name] and agrees for palliative care.   In ED, [place] stable afebrile blood work shows significant leukocytosis at 77K, Hb 7.2gm, CVOID 19 was negative,UA pending- but no urinary symptoms.  Chest x-ray no active disease, CT cervical spine without contrast-no acute abnormalities as congenital fusion, congenital versus cervical fusion focal spinal stenosis. EDP spoke w/ Dr [name] advised admission, transfusion prbc for admitting anemia and palliative care evaluation.  Review of Systems: All systems were reviewed and were negative except as mentioned in HPI above. Negative for fever Negative for chest pain Negative for shortness of breath  Past Medical History:  Diagnosis Date  . Atrial fibrillation ([place])   . Bladder cancer ([place]) dx'd 03/2019    Past Surgical History:  Procedure Laterality Date  . BACK SURGERY       reports that he has never smoked. He has never used smokeless tobacco. He reports previous alcohol use. He reports that he does not use  drugs.  No Known Allergies  Family History  Problem Relation Age of Onset  . Hodgkin's lymphoma Mother   . Bladder Cancer Father   . Cancer Brother      Prior to Admission medications   Medication Sig Start Date End Date Taking? Authorizing Provider  acetaminophen (TYLENOL) 500 MG tablet Take 1,000 mg by mouth 2 (two) times daily.    Yes [provider]  amiodarone (PACERONE) 100 MG tablet Take 1 tablet (100 mg total) by mouth as directed. M,W,F 02/03/20  Yes Patwardhan, Reynold Bowen, MD  apixaban (ELIQUIS) 2.5 MG TABS tablet Take 1 tablet (2.5 mg total) by mouth 2 (two) times daily. 10/07/19  Yes Patwardhan, Manish J, MD  cetirizine (ZYRTEC) 10 MG tablet Take 10 mg by mouth daily as needed for allergies.    Yes [provider]  Cholecalciferol (VITAMIN D3 PO) Take 1 tablet by mouth daily.    Yes [provider]  doxazosin (CARDURA) 8 MG tablet Take 4 mg by mouth daily. 03/10/19  Yes [provider]  fluticasone (FLONASE) 50 MCG/ACT nasal spray Place 2 sprays into both nostrils daily as needed for allergies.  03/24/18  Yes [provider]  furosemide (LASIX) 40 MG tablet Take 1 tablet (40 mg total) by mouth daily. 02/03/20 02/02/21 Yes Patwardhan, Manish J, MD  gabapentin (NEURONTIN) 100 MG capsule Take 100 mg by mouth 2 (two) times daily.  02/17/19  Yes [provider]  glycerin adult 2 g suppository Place 1 suppository rectally as needed for constipation. 02/20/20  Yes Carlisle Cater, PA-C  megestrol (MEGACE) 400 MG/10ML suspension Take 10 mLs (400 mg total) by mouth 2 (two) times daily. 08/18/19  Yes Wyatt Portela, MD  mirabegron ER (MYRBETRIQ) 25 MG TB24 tablet Take 25 mg by mouth daily.   Yes [provider]  Multiple Vitamins-Minerals (ICAPS AREDS 2 PO) Take 1 tablet by mouth daily.   Yes [provider]  OXYCONTIN 10 MG 12 hr tablet Take 1 tablet by mouth 2 (two) times daily. 02/23/19  Yes [provider]     Physical Exam: Vitals:   02/24/20 1330 02/24/20 1400 02/24/20 1430 02/24/20 1432  BP: (!) 116/54 (!) 108/52 (!) 118/51 (!) 118/51  Pulse: 74 77 76 73  Resp: (!) 26 17 15  (!) 21  Temp:      TempSrc:      SpO2: 94% 95% 99% 96%   General exam: AAOx3, ill frail, on RA, not in distress. HEENT:Oral mucosa moist, Ear/Nose WNL grossly, dentition normal. Respiratory system: bilaterally clear,no wheezing or crackles,no use of accessory muscle Cardiovascular system: S1 & S2 +, No JVD,. Gastrointestinal system: Abdomen soft, NT,ND, BS+ Nervous System:Alert, awake, moving extremities and grossly nonfocal Extremities: leg edema+ b/l ankle, distal peripheral pulses palpable.  Skin: No rashes,no icterus. MSK: thin muscle bulk,tone, power   Labs on Admission: I have personally reviewed following labs and imaging studies  CBC: Recent Labs  Lab 02/20/20 0938 02/20/20 1242 02/24/20 1258  WBC 43.2* 40.1* 77.5*  NEUTROABS  --  24.8*  --   HGB 8.0* 7.5* 7.2*  HCT 24.1* 22.2* 22.0*  MCV 96.4 94.9 97.8  PLT 54* 46* 62*   Basic Metabolic Panel: Recent Labs  Lab 02/20/20 0938 02/24/20 1258  NA 139 139  K 3.2* 3.6  CL 101 101  CO2 25 25  GLUCOSE 136* 153*  BUN 28* 38*  CREATININE 1.50* 1.77*  CALCIUM 9.1 8.5*   GFR: Estimated Creatinine Clearance: 24.4 mL/min (A) (by C-G formula based on SCr of 1.77 mg/dL (H)). Liver Function Tests: Recent Labs  Lab 02/20/20 0938  AST 25  ALT 21  ALKPHOS 100  BILITOT 1.3*  PROT 6.7  ALBUMIN 3.3*   Recent Labs  Lab 02/20/20 0938  LIPASE 19   No results for input(s): AMMONIA in the last 168 hours. Coagulation Profile: No results for input(s): INR, PROTIME in the last 168 hours. Cardiac Enzymes: No results for input(s): CKTOTAL, CKMB, CKMBINDEX, TROPONINI in the last 168 hours. BNP (last 3 results) No results for input(s): PROBNP in the last 8760 hours. HbA1C: No results for input(s): HGBA1C in the last 72 hours. CBG: No results  for input(s): GLUCAP in the last 168 hours. Lipid Profile: No results for input(s): CHOL, HDL, LDLCALC, TRIG, CHOLHDL, LDLDIRECT in the last 72 hours. Thyroid Function Tests: No results for input(s): TSH, T4TOTAL, FREET4, T3FREE, THYROIDAB in the last 72 hours. Anemia Panel: No results for input(s): VITAMINB12, FOLATE, FERRITIN, TIBC, IRON, RETICCTPCT in the last 72 hours. Urine analysis:    Component Value Date/Time   COLORURINE YELLOW 01/02/2020 2003   APPEARANCEUR HAZY (A) 01/02/2020 2003   LABSPEC 1.023 01/02/2020 2003   PHURINE 5.0 01/02/2020 2003   GLUCOSEU NEGATIVE 01/02/2020 2003   HGBUR NEGATIVE 01/02/2020 2003   [place] NEGATIVE 01/02/2020 2003   [place] NEGATIVE 01/02/2020 2003   PROTEINUR 100 (A) 01/02/2020 2003   NITRITE NEGATIVE 01/02/2020 2003   LEUKOCYTESUR NEGATIVE 01/02/2020 2003    Radiological Exams on Admission: CT Cervical Spine Wo Contrast  Result Date: 02/24/2020 CLINICAL DATA:  Neck pain for more than 4 weeks. Generalized weakness since yesterday. Unable to get up. LEFT-sided neck pain. EXAM: CT CERVICAL SPINE WITHOUT CONTRAST TECHNIQUE: Multidetector CT imaging of the cervical spine was performed without intravenous contrast. Multiplanar CT image reconstructions were also generated. COMPARISON:  PET-CT on 06/11/2019 FINDINGS: Alignment: There is loss of cervical lordosis. Otherwise alignment is normal. Skull base and vertebrae: Note is made of fluid within the sphenoid air cells. There is dense atherosclerotic calcification of the internal carotid arteries. 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 There is developmental versus surgical fusion of C6-7. 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 No acute vertebral fracture. Soft tissues and spinal canal: There is narrowing of the spinal canal posterior to C6-7, measuring 14 millimeters. Disc levels: Significant degenerative changes throughout the cervical spine. Vacuum  disc phenomenon at C4-5. Significant disc height loss with uncovertebral spurring at C5-6. Disc height loss in the UPPER thoracic levels. Upper chest: Negative. Other: None. IMPRESSION: 1. No acute abnormality of the cervical spine. 2. 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 3. 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 4. 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 Appearance favors chronic abnormality. Consider C-spine MRI as needed for further evaluation. Electronically Signed   By: Nolon Nations M.D.   On: 02/24/2020 15:12   DG Chest Portable 1 View  Result Date: 02/24/2020 CLINICAL DATA:  Weakness EXAM: PORTABLE CHEST 1 VIEW COMPARISON:  01/02/2020 FINDINGS: The heart size and mediastinal contours are stable. No focal airspace consolidation, pleural effusion, or pneumothorax. The visualized skeletal structures are unremarkable. IMPRESSION: No active disease. Electronically Signed   By: Davina Poke D.O.   On: 02/24/2020 15:31    Assessment/Plan  Generalized weakness/debility: Multifactorial in the setting of progressive MDS, anemia, FTT.Consult palliative care. Gentle hydration, 1 unit prbc transfusion, cont home megace.  MDS diagnosed in March 2021, his WBC counts are rapidly worsening, recent follow-up in beginning of April had 7% blasts per onco note. Will order differential count. Hematology consulted in ED.  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 Hb 8.0 gm 4/25->7.5->7.2 getting 1 unit of PRBC for symptomatic anemia repeat CBC.  Urothelial bladder cancer  In May 2020: Definitive therapy with radiation as well as weekly carboplatin completed May 17, 2019.  Followed with Dr. [name].  Paroxysmal atrial fibrillation on eliquis/amio-given his worsening anemia in the setting of MDS we will need to weigh in risks/benefits. Cont for now  CKD (chronic kidney disease) stage 3b: Being slightly up will keep him on gentle IV fluid hydration overnight. Recent  Labs  Lab 02/20/20 0938 02/24/20 1258  BUN 28* 38*  CREATININE 1.50* 1.77*   Chronic Leg edema- cont home lasix. Cont home cardura.  Thrombocytopenia: In the setting of MDS, platelet most recently [id number] today.monitor.  Chronicc OA/ Neck pain- cont home neurontin and oxycontin-hold for sedation.  Goals of care patient is DNR.  Given his rapidly worsening WBC count, and recently diagnosed MDS we will get palliative care involved and consult hematology. Daughter agrees with it. She states Dr [name] had informed them to prepare after his MDS diagnosis. Prognosis is poor.  Severity of Illness: The appropriate patient status for this patient is INPATIENT. Inpatient status is judged to be reasonable and necessary in order to provide the required intensity of service to ensure the patient's safety. The patient's presenting symptoms, physical exam findings, and initial radiographic and laboratory data in the context of  their chronic comorbidities is felt to place them at high risk for further clinical deterioration. Furthermore, it is not anticipated that the patient will be medically stable for discharge from the hospital within 2 midnights of admission. The following factors support the patient status of inpatient.   " The patient's presenting symptoms include severe weakness symptomatic anemia. " The worrisome physical exam findings include generalized weakness " The initial radiographic and laboratory data are worrisome because of extreme leukocytosis " The chronic co-morbidities include overlays, atrial fibrillation, history of bladder cancer, MDS, CKD, anemia.   * I certify that at the point of admission it is my clinical judgment that the patient will require inpatient hospital care spanning beyond 2 midnights from the point of admission due to high intensity of service, high risk for further deterioration and high frequency of surveillance required.    DVT prophylaxis:   SCD/eliquis Code Status:DNR Family Communication: Admission, patients condition and plan of care including tests being ordered have been discussed with the patient and hid daughter who indicate understanding and agree with the plan and Code Status.  Consults called:   Antonieta Pert MD [facility] Pager [phone]  If 7PM-7AM, please contact night-coverage [email or web address]  02/24/2020, 4:08 PM

## 2020-02-24 NOTE — ED Provider Notes (Signed)
[facility] DEPT Provider Note   CSN: [id number] Arrival date & time: 02/24/20  1237     History No chief complaint on file.   Terry Sullivan is a 86 y.o. male.  The history is provided by the patient, a relative and medical records. No language interpreter was used.     86 year old male with history of bladder cancer, atrial fibrillation currently on Eliquis, and family history of non-Hodgkin's lymphoma presenting to the ED via EMS from home due to increased weakness.  Patient is at home, usually use a walker to ambulate however for the past week he has had progressive worsening weakness and today he could not get out of his bed due to his weakness.  He endorsed some discomfort to his neck since yesterday without any signs of injury.  He states he just feels wiped out.  He denies any fever chills chest pain shortness of breath or productive, nausea vomiting or diarrhea.  He does endorse some decreased appetite.  No dysuria.  He was diagnosed with bladder cancer and was treated with chemo and radiation last year.  He has had anemia treated with Procrit, as well as blood transfusion.  Verified patient's oncologist is Dr. [name] who has been caring for his urothelial carcinoma.  Patient underwent a bone marrow biopsy in March 2020 21st which shows anemia was multifactorial in nature and related to myelodysplastic syndrome and chronic renal insufficiency.  He is DNR.  Past Medical History:  Diagnosis Date  . Atrial fibrillation ([place])   . Bladder cancer ([place]) dx'd 03/2019    Patient Active Problem List   Diagnosis Date Noted  . Thrombocytopenia ([place]) 02/03/2020  . Bilateral leg edema 02/03/2020  . Edema 02/03/2020  . Advanced care planning/counseling discussion 02/03/2020  . Anemia in chronic kidney disease 01/31/2020  . Atrial fibrillation with RVR ([place]) 01/02/2020  . Anemia 01/02/2020  . CKD (chronic kidney disease) stage 3, GFR 30-59 ml/min   .  Paroxysmal atrial fibrillation ([place]) 04/10/2019  . Bradycardia 04/10/2019  . Mild mitral regurgitation 04/10/2019  . Bladder cancer ([place]) 03/17/2019  . Primary osteoarthritis of both hands 12/25/2018  . Trigger ring finger of right hand 12/25/2018    Past Surgical History:  Procedure Laterality Date  . BACK SURGERY         Family History  Problem Relation Age of Onset  . Hodgkin's lymphoma Mother   . Bladder Cancer Father   . Cancer Brother     Social History   Tobacco Use  . Smoking status: Never Smoker  . Smokeless tobacco: Never Used  Substance Use Topics  . Alcohol use: Not Currently  . Drug use: Never    Home Medications Prior to Admission medications   Medication Sig Start Date End Date Taking? Authorizing Provider  acetaminophen (TYLENOL) 500 MG tablet Take 500 mg by mouth 2 (two) times daily.    [provider]  amiodarone (PACERONE) 100 MG tablet Take 1 tablet (100 mg total) by mouth as directed. M,W,F 02/03/20   Patwardhan, Reynold Bowen, MD  apixaban (ELIQUIS) 2.5 MG TABS tablet Take 1 tablet (2.5 mg total) by mouth 2 (two) times daily. 10/07/19   Patwardhan, Reynold Bowen, MD  cetirizine (ZYRTEC) 10 MG tablet Take by mouth as needed.    [provider]  Cholecalciferol (VITAMIN D3 PO) Take by mouth.    [provider]  doxazosin (CARDURA) 8 MG tablet Take 4 mg by mouth daily. 03/10/19   [provider]  fluticasone (FLONASE) 50 MCG/ACT nasal spray INSTILL 2 SPRAYS INTO EACH NOSTRIL ONCE DAILY IF NEEDED 03/24/18   [provider]  furosemide (LASIX) 40 MG tablet Take 1 tablet (40 mg total) by mouth daily. 02/03/20 02/02/21  Patwardhan, Reynold Bowen, MD  gabapentin (NEURONTIN) 100 MG capsule Take 100 mg by mouth 2 (two) times daily.  02/17/19   [provider]  glycerin adult 2 g suppository Place 1 suppository rectally as needed for constipation. 02/20/20   Carlisle Cater, PA-C  megestrol (MEGACE) 400 MG/10ML suspension Take 10 mLs  (400 mg total) by mouth 2 (two) times daily. 08/18/19   Wyatt Portela, MD  mirabegron ER (MYRBETRIQ) 25 MG TB24 tablet Take 25 mg by mouth daily.    [provider]  Multiple Vitamins-Minerals (ICAPS AREDS 2 PO) Take 1 tablet by mouth daily.    [provider]  OXYCONTIN 10 MG 12 hr tablet Take 1 tablet by mouth 2 (two) times daily. 02/23/19   [provider]    Allergies    Patient has no known allergies.  Review of Systems   Review of Systems  All other systems reviewed and are negative.   Physical Exam Updated Vital Signs BP (!) 116/54   Pulse 74   Temp 98.1 F (36.7 C) (Oral)   Resp (!) 26   SpO2 94%   Physical Exam Vitals and nursing note reviewed.  Constitutional:      Appearance: He is well-developed.     Comments: Elderly male, nontachypneic, appears deconditioned.  HENT:     Head: Atraumatic.  Eyes:     Conjunctiva/sclera: Conjunctivae normal.  Cardiovascular:     Rate and Rhythm: Normal rate and regular rhythm.  Pulmonary:     Breath sounds: No wheezing or rales.     Comments: Tachypneic. Abdominal:     Palpations: Abdomen is soft.  Musculoskeletal:        General: No tenderness.     Cervical back: Neck supple.  Skin:    Findings: No rash.  Neurological:     Mental Status: He is alert. Mental status is at baseline.     Comments: 4 out of 5 strength all 4 extremities     ED Results / Procedures / Treatments   Labs (all labs ordered are listed, but only abnormal results are displayed) Labs Reviewed  BASIC METABOLIC PANEL - Abnormal; Notable for the following components:      Result Value   Glucose, Bld 153 (*)    BUN 38 (*)    Creatinine, Ser 1.77 (*)    Calcium 8.5 (*)    GFR calc non Af Amer 32 (*)    GFR calc Af Amer 38 (*)    All other components within normal limits  CBC - Abnormal; Notable for the following components:   WBC 77.5 (*)    RBC 2.25 (*)    Hemoglobin 7.2 (*)    HCT 22.0 (*)    RDW 16.0 (*)     Platelets 62 (*)    All other components within normal limits  URINALYSIS, ROUTINE W REFLEX MICROSCOPIC    EKG None  ED ECG REPORT   Date: 02/24/2020  Rate: 77  Rhythm: atrial fibrillation  QRS Axis: right  Intervals: QT prolonged  ST/T Wave abnormalities: nonspecific ST/T changes  Conduction Disutrbances:right bundle branch block  Narrative Interpretation:   Old EKG Reviewed: unchanged  I have personally reviewed the EKG tracing and agree with the computerized printout as  noted.   Radiology CT Cervical Spine Wo Contrast  Result Date: 02/24/2020 CLINICAL DATA:  Neck pain for more than 4 weeks. Generalized weakness since yesterday. Unable to get up. LEFT-sided neck pain. EXAM: CT CERVICAL SPINE WITHOUT CONTRAST TECHNIQUE: Multidetector CT imaging of the cervical spine was performed without intravenous contrast. Multiplanar CT image reconstructions were also generated. COMPARISON:  PET-CT on 06/11/2019 FINDINGS: Alignment: There is loss of cervical lordosis. Otherwise alignment is normal. Skull base and vertebrae: Note is made of fluid within the sphenoid air cells. There is dense atherosclerotic calcification of the internal carotid arteries. 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 There is developmental versus surgical fusion of C6-7. 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 No acute vertebral fracture. Soft tissues and spinal canal: There is narrowing of the spinal canal posterior to C6-7, measuring 14 millimeters. Disc levels: Significant degenerative changes throughout the cervical spine. Vacuum disc phenomenon at C4-5. Significant disc height loss with uncovertebral spurring at C5-6. Disc height loss in the UPPER thoracic levels. Upper chest: Negative. Other: None. IMPRESSION: 1. No acute abnormality of the cervical spine. 2. 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 3. 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 4. 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 Appearance favors chronic abnormality. Consider C-spine MRI as needed for further evaluation. Electronically Signed   By: Nolon Nations M.D.   On: 02/24/2020 15:12   DG Chest Portable 1 View  Result Date: 02/24/2020 CLINICAL DATA:  Weakness EXAM: PORTABLE CHEST 1 VIEW COMPARISON:  01/02/2020 FINDINGS: The heart size and mediastinal contours are stable. No focal airspace consolidation, pleural effusion, or pneumothorax. The visualized skeletal structures are unremarkable. IMPRESSION: No active disease. Electronically Signed   By: Davina Poke D.O.   On: 02/24/2020 15:31    Procedures Procedures (including critical care time)  Medications Ordered in ED Medications  sodium chloride flush (NS) 0.9 % injection 3 mL ( Intravenous Canceled Entry 02/24/20 1301)  0.9 %  sodium chloride infusion (has no administration in time range)    ED Course  I have reviewed the triage vital signs and the nursing notes.  Pertinent labs & imaging results that were available during my care of the patient were reviewed by me and considered in my medical decision making (see chart for details).    MDM Rules/Calculators/A&P                      BP (!) 118/51   Pulse 73   Temp 98.1 F (36.7 C) (Oral)   Resp (!) 21   SpO2 96%   Final Clinical Impression(s) / ED Diagnoses Final diagnoses:  MDS (myelodysplastic syndrome) (HCC)  Symptomatic anemia    Rx / DC Orders ED Discharge Orders    None     2:13 PM Patient with progressive manifestations of his myelodysplastic syndrome leading to progressive weakness and inability to get out of bed today prompting this ER visit.  Labs remarkable for leukocytosis with WBC 77.5.  It was in the normal range 2 weeks ago.  His hemoglobin is 7.2, similar to 4 days prior.  At that time he was complaining of constipation.  CT scan did not show anything acute.  Patient was consulted by on-call oncologist Dr. Irene Limbo who recommend patient to follow-up with  his oncologist for further evaluation.  In the setting of worsening leukocytosis, generalized weakness, and now having neck pain, will consult medicine for admission.  Will obtain CT scan of the cervical spine to rule out malignancy.  Patient would likely benefit from hospice/palliative involvement.  3:13 PM Appreciate consultation from oncologist DR. Shadad who agrees with blood transfusion and admission for transition to palliative care. Pt and family member are aware and agrees with plan.  Blood transfusion given.    3:54 PM Appreciate consultation from [place], Dr. Maren Beach who agrees to see and admit pt for blood transfusion and to help with palliative care transition. COVID-19 screening test have resulted and negative.  Terry Sullivan was evaluated in Emergency Department on 02/24/2020 for the symptoms described in the history of present illness. He was evaluated in the context of the global COVID-19 pandemic, which necessitated consideration that the patient might be at risk for infection with the SARS-CoV-2 virus that causes COVID-19. Institutional protocols and algorithms that pertain to the evaluation of patients at risk for COVID-19 are in a state of rapid change based on information released by regulatory bodies including the CDC and federal and state organizations. These policies and algorithms were followed during the patient's care in the ED.    Domenic Moras, PA-C 02/24/20 [place], MD 02/25/20 1655

## 2020-02-24 NOTE — ED Notes (Signed)
Pt lying in bed awake watching t.v. asking about bed assignment upstairs. Updated pt on plan of care. no s/s of transfusion reaction.

## 2020-02-25 ENCOUNTER — Encounter (HOSPITAL_COMMUNITY): Payer: Self-pay | Admitting: Internal Medicine

## 2020-02-25 ENCOUNTER — Encounter: Payer: Self-pay | Admitting: Oncology

## 2020-02-25 DIAGNOSIS — D469 Myelodysplastic syndrome, unspecified: Secondary | ICD-10-CM

## 2020-02-25 DIAGNOSIS — G8929 Other chronic pain: Secondary | ICD-10-CM

## 2020-02-25 DIAGNOSIS — D649 Anemia, unspecified: Secondary | ICD-10-CM

## 2020-02-25 DIAGNOSIS — C678 Malignant neoplasm of overlapping sites of bladder: Secondary | ICD-10-CM

## 2020-02-25 LAB — CBC WITH DIFFERENTIAL/PLATELET
Abs Immature Granulocytes: 2.7 10*3/uL — ABNORMAL HIGH (ref 0.00–0.07)
Band Neutrophils: 3 %
Basophils Absolute: 0 10*3/uL (ref 0.0–0.1)
Basophils Relative: 0 %
Blasts: 10 %
Eosinophils Absolute: 0 10*3/uL (ref 0.0–0.5)
Eosinophils Relative: 0 %
HCT: 24.1 % — ABNORMAL LOW (ref 39.0–52.0)
Hemoglobin: 8.2 g/dL — ABNORMAL LOW (ref 13.0–17.0)
Lymphocytes Relative: 7 %
Lymphs Abs: 4.6 10*3/uL — ABNORMAL HIGH (ref 0.7–4.0)
MCH: 31.9 pg (ref 26.0–34.0)
MCHC: 34 g/dL (ref 30.0–36.0)
MCV: 93.8 fL (ref 80.0–100.0)
Metamyelocytes Relative: 1 %
Monocytes Absolute: 10.6 10*3/uL — ABNORMAL HIGH (ref 0.1–1.0)
Monocytes Relative: 16 %
Myelocytes: 3 %
Neutro Abs: 41.8 10*3/uL — ABNORMAL HIGH (ref 1.7–7.7)
Neutrophils Relative %: 60 %
Platelets: 51 10*3/uL — ABNORMAL LOW (ref 150–400)
RBC: 2.57 MIL/uL — ABNORMAL LOW (ref 4.22–5.81)
RDW: 16.3 % — ABNORMAL HIGH (ref 11.5–15.5)
WBC: 66.3 10*3/uL (ref 4.0–10.5)
nRBC: 0.1 % (ref 0.0–0.2)

## 2020-02-25 LAB — COMPREHENSIVE METABOLIC PANEL
ALT: 18 U/L (ref 0–44)
AST: 19 U/L (ref 15–41)
Albumin: 3.2 g/dL — ABNORMAL LOW (ref 3.5–5.0)
Alkaline Phosphatase: 95 U/L (ref 38–126)
Anion gap: 13 (ref 5–15)
BUN: 34 mg/dL — ABNORMAL HIGH (ref 8–23)
CO2: 25 mmol/L (ref 22–32)
Calcium: 8.5 mg/dL — ABNORMAL LOW (ref 8.9–10.3)
Chloride: 102 mmol/L (ref 98–111)
Creatinine, Ser: 1.51 mg/dL — ABNORMAL HIGH (ref 0.61–1.24)
GFR calc Af Amer: 45 mL/min — ABNORMAL LOW (ref >60)
GFR calc non Af Amer: 39 mL/min — ABNORMAL LOW (ref >60)
Glucose, Bld: 128 mg/dL — ABNORMAL HIGH (ref 70–99)
Potassium: 3.2 mmol/L — ABNORMAL LOW (ref 3.5–5.1)
Sodium: 140 mmol/L (ref 135–145)
Total Bilirubin: 1.2 mg/dL (ref 0.3–1.2)
Total Protein: 6.4 g/dL — ABNORMAL LOW (ref 6.5–8.1)

## 2020-02-25 LAB — URINALYSIS, ROUTINE W REFLEX MICROSCOPIC
Bacteria, UA: NONE SEEN
Bilirubin Urine: NEGATIVE
Glucose, UA: NEGATIVE mg/dL
Ketones, ur: NEGATIVE mg/dL
Leukocytes,Ua: NEGATIVE
Nitrite: NEGATIVE
Protein, ur: NEGATIVE mg/dL
Specific Gravity, Urine: 1.01 (ref 1.005–1.030)
pH: 5 (ref 5.0–8.0)

## 2020-02-25 LAB — ABO/RH: ABO/RH(D): O NEG

## 2020-02-25 MED ORDER — DOCUSATE SODIUM 100 MG PO CAPS: 100.0000 mg | ORAL_CAPSULE | Freq: Two times a day (BID) | ORAL | 0 refills | Status: AC

## 2020-02-25 MED ORDER — OXYCODONE HCL ER 15 MG PO T12A: 15.0000 mg | EXTENDED_RELEASE_TABLET | Freq: Two times a day (BID) | ORAL | Status: DC

## 2020-02-25 MED ORDER — OXYCODONE HCL 5 MG PO TABS: 5.0000 mg | ORAL_TABLET | ORAL | 0 refills | Status: AC | PRN

## 2020-02-25 MED ORDER — BISACODYL 5 MG PO TBEC: 5.0000 mg | DELAYED_RELEASE_TABLET | Freq: Every day | ORAL | 0 refills | Status: AC | PRN

## 2020-02-25 MED ORDER — OXYCODONE HCL ER 15 MG PO T12A: 15.0000 mg | EXTENDED_RELEASE_TABLET | Freq: Two times a day (BID) | ORAL | 0 refills | Status: AC

## 2020-02-25 MED ORDER — OXYCODONE HCL 5 MG PO TABS
5.0000 mg | ORAL_TABLET | ORAL | Status: DC | PRN
  Administered 2020-02-25: 13:00:00 10 mg via ORAL
  Filled 2020-02-25: qty 2

## 2020-02-25 NOTE — Discharge Summary (Signed)
Physician Discharge Summary  Terry Sullivan [id number] DOB: February 17, 1924 DOA: 02/24/2020  PCP: [name], MD  Admit date: 02/24/2020 Discharge date: 02/25/2020  Admitted From: Home Disposition: Home with hosipce  Recommendations for Outpatient Follow-up:  1. Blood transfusion per goals of care  Discharge Condition: Stable CODE STATUS: DNR Diet recommendation: Regular   Brief/Interim Summary:  Admission HPI written by Antonieta Pert, MD   Chief Complaint: Generalized Weakness  HPI: Terry Sullivan is a 86 y.o. male with medical history significant for A. fib on Eliquis, history of bladder cancer status post chemo posterior year, CKD stage IIIb, myelodysplastic syndrome diagnosed in March/2021, thrombocytopenia, anemia, follows with Dr. [name] comes from home with complaint of generalized weakness for a week and progressively worsening. He also complainsof neck pain but attributes to his sleeping position.  Patient was not able to get up today so brought to the ED. He was seen 4 days ago for constipation and  wbc was 43k, and was normal 2 wks ago and sent home.  He denies any chest pain, nausea, vomiting, fever chills. He has mild shortness of breath. He had BM. He fees like not living any more. Daughter at bedside reports they spoke w/ Dr [name] and agrees for palliative care.   In ED, [place] stable afebrile blood work shows significant leukocytosis at 77K, Hb 7.2gm, CVOID 19 was negative,UA pending- but no urinary symptoms.  Chest x-ray no active disease, CT cervical spine without contrast-no acute abnormalities as congenital fusion, congenital versus cervical fusion focal spinal stenosis. EDP spoke w/ Dr [name] advised admission, transfusion prbc for admitting anemia and palliative care evaluation.    Hospital course:  Generalized weakness Likely multifactorial in setting of anemia and underlying myelodysplastic syndrome. Some improvement after blood  transfusion.  Myelodysplastic syndrome Oncology visited patient. Plan to transition to comfort measures only. Patient set up with home hospice.  Back pain Chronic. Increase to MS Contin 15 mg BID and will add oxycodone 5-10 mg q4 hours prn.  Symptomatic anemia Secondary to above. Given 1 unit of PRBC with improvement in symptoms.  PAF CKD Leg edema Thrombocytopenia  Discharge Diagnoses:  Active Problems:   Bladder cancer (HCC)   Paroxysmal atrial fibrillation (HCC)   CKD (chronic kidney disease) stage 3, GFR 30-59 ml/min   Anemia in chronic kidney disease   Thrombocytopenia (HCC)   Symptomatic anemia    Discharge Instructions   Allergies as of 02/25/2020   No Known Allergies     Medication List    TAKE these medications   acetaminophen 500 MG tablet Commonly known as: TYLENOL Take 1,000 mg by mouth 2 (two) times daily.   amiodarone 100 MG tablet Commonly known as: PACERONE Take 1 tablet (100 mg total) by mouth as directed. M,W,F   apixaban 2.5 MG Tabs tablet Commonly known as: Eliquis Take 1 tablet (2.5 mg total) by mouth 2 (two) times daily.   bisacodyl 5 MG EC tablet Commonly known as: DULCOLAX Take 1 tablet (5 mg total) by mouth daily as needed for moderate constipation.   cetirizine 10 MG tablet Commonly known as: ZYRTEC Take 10 mg by mouth daily as needed for allergies.   docusate sodium 100 MG capsule Commonly known as: COLACE Take 1 capsule (100 mg total) by mouth 2 (two) times daily.   doxazosin 8 MG tablet Commonly known as: CARDURA Take 4 mg by mouth daily.   fluticasone 50 MCG/ACT nasal spray Commonly known as: FLONASE Place 2 sprays into both nostrils daily  as needed for allergies.   furosemide 40 MG tablet Commonly known as: Lasix Take 1 tablet (40 mg total) by mouth daily.   gabapentin 100 MG capsule Commonly known as: NEURONTIN Take 100 mg by mouth 2 (two) times daily.   glycerin adult 2 g suppository Place 1 suppository  rectally as needed for constipation.   ICAPS AREDS 2 PO Take 1 tablet by mouth daily.   megestrol 400 MG/10ML suspension Commonly known as: MEGACE Take 10 mLs (400 mg total) by mouth 2 (two) times daily.   mirabegron ER 25 MG Tb24 tablet Commonly known as: MYRBETRIQ Take 25 mg by mouth daily.   oxyCODONE 15 mg 12 hr tablet Commonly known as: OXYCONTIN Take 1 tablet (15 mg total) by mouth 2 (two) times daily. What changed:   medication strength  how much to take   oxyCODONE 5 MG immediate release tablet Commonly known as: Oxy IR/ROXICODONE Take 1-2 tablets (5-10 mg total) by mouth every 4 (four) hours as needed (Use for moderate to severe breakthrough pain). What changed: You were already taking a medication with the same name, and this prescription was added. Make sure you understand how and when to take each.   VITAMIN D3 PO Take 1 tablet by mouth daily.       No Known Allergies  Consultations:  Oncology   Procedures/Studies: CT Cervical Spine Wo Contrast  Result Date: 02/24/2020 CLINICAL DATA:  Neck pain for more than 4 weeks. Generalized weakness since yesterday. Unable to get up. LEFT-sided neck pain. EXAM: CT CERVICAL SPINE WITHOUT CONTRAST TECHNIQUE: Multidetector CT imaging of the cervical spine was performed without intravenous contrast. Multiplanar CT image reconstructions were also generated. COMPARISON:  PET-CT on 06/11/2019 FINDINGS: Alignment: There is loss of cervical lordosis. Otherwise alignment is normal. Skull base and vertebrae: Note is made of fluid within the sphenoid air cells. There is dense atherosclerotic calcification of the internal carotid arteries. 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 There is developmental versus surgical fusion of C6-7. 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 No acute vertebral fracture. Soft tissues and spinal canal: There is narrowing of the spinal canal posterior to C6-7,  measuring 14 millimeters. Disc levels: Significant degenerative changes throughout the cervical spine. Vacuum disc phenomenon at C4-5. Significant disc height loss with uncovertebral spurring at C5-6. Disc height loss in the UPPER thoracic levels. Upper chest: Negative. Other: None. IMPRESSION: 1. No acute abnormality of the cervical spine. 2. 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 3. 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 4. 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 Appearance favors chronic abnormality. Consider C-spine MRI as needed for further evaluation. Electronically Signed   By: Nolon Nations M.D.   On: 02/24/2020 15:12   CT ABDOMEN PELVIS W CONTRAST  Result Date: 02/20/2020 CLINICAL DATA:  Abdominal distension, bowel obstruction suspected, constipation for 8 days, elevated WBC, history of bladder cancer EXAM: CT ABDOMEN AND PELVIS WITH CONTRAST TECHNIQUE: Multidetector CT imaging of the abdomen and pelvis was performed using the standard protocol following bolus administration of intravenous contrast. CONTRAST:  73mL OMNIPAQUE IOHEXOL 300 MG/ML  SOLN COMPARISON:  09/30/2019 FINDINGS: Lower chest: No acute abnormality. Cardiomegaly. Coronary artery calcifications. Hepatobiliary: No solid liver abnormality is seen. No gallstones, gallbladder wall thickening, or biliary dilatation. Pancreas: Unremarkable. No pancreatic ductal dilatation or surrounding inflammatory changes. Spleen: Normal in size without significant abnormality. Adrenals/Urinary Tract: Adrenal glands are unremarkable. Kidneys are normal, without renal calculi, solid  lesion, or hydronephrosis. Bladder is unremarkable. Stomach/Bowel: Stomach is within normal limits. Small incidental diverticulum of the descending duodenum. The appendix is not clearly visualized. Generally moderate burden of stool throughout the colon with large stool balls in the rectum. Vascular/Lymphatic: Aortic atherosclerosis. No enlarged abdominal or pelvic  lymph nodes. Reproductive: No mass or other significant abnormality. Other: No abdominal wall hernia or abnormality. No abdominopelvic ascites. Musculoskeletal: No acute or significant osseous findings. Severe disc degenerative disease and osteophytosis of the lumbar spine. IMPRESSION: 1.  No evidence of bowel obstruction. 2. Large stool balls in the rectum with a generally moderate burden of stool throughout the colon otherwise. 3. No evidence of malignancy in the abdomen or pelvis per reported history of bladder cancer. 4.  Coronary artery disease.  Aortic Atherosclerosis (ICD10-I70.0). Electronically Signed   By: Eddie Candle M.D.   On: 02/20/2020 13:38   DG Chest Portable 1 View  Result Date: 02/24/2020 CLINICAL DATA:  Weakness EXAM: PORTABLE CHEST 1 VIEW COMPARISON:  01/02/2020 FINDINGS: The heart size and mediastinal contours are stable. No focal airspace consolidation, pleural effusion, or pneumothorax. The visualized skeletal structures are unremarkable. IMPRESSION: No active disease. Electronically Signed   By: Davina Poke D.O.   On: 02/24/2020 15:31      Subjective: Some neck pain. Was able to ambulate to the bathroom  Discharge Exam: Vitals:   02/25/20 1348 02/25/20 1351  BP:  (!) 95/47  Pulse: 73 72  Resp: 18   Temp: 100.1 F (37.8 C)   SpO2: (!) 89% (!) 89%   Vitals:   02/25/20 0855 02/25/20 0948 02/25/20 1348 02/25/20 1351  BP: 135/61 (!) 106/53  (!) 95/47  Pulse: 77 79 73 72  Resp:  19 18   Temp:  97.7 F (36.5 C) 100.1 F (37.8 C)   TempSrc:  Oral Oral   SpO2:  95% (!) 89% (!) 89%    General: Pt is alert, awake, not in acute distress Cardiovascular: RRR, S1/S2 +, no rubs, no gallops Respiratory: CTA bilaterally, no wheezing, no rhonchi Abdominal: Soft, NT, ND, bowel sounds + Extremities: no cyanosis    The results of significant diagnostics from this hospitalization (including imaging, microbiology, ancillary and laboratory) are listed below for  reference.     Microbiology: Recent Results (from the past 240 hour(s))  Respiratory Panel by RT PCR (Flu A&B, Covid) - Nasopharyngeal Swab     Status: None   Collection Time: 02/24/20  2:32 PM   Specimen: Nasopharyngeal Swab  Result Value Ref Range Status   SARS Coronavirus 2 by RT PCR NEGATIVE NEGATIVE Final    Comment: (NOTE) SARS-CoV-2 target nucleic acids are NOT DETECTED. The SARS-CoV-2 RNA is generally detectable in upper respiratoy specimens during the acute phase of infection. The lowest concentration of SARS-CoV-2 viral copies this assay can detect is 131 copies/mL. A negative result does not preclude SARS-Cov-2 infection and should not be used as the sole basis for treatment or other patient management decisions. A negative result may occur with  improper specimen collection/handling, submission of specimen other than nasopharyngeal swab, presence of viral mutation(s) within the areas targeted by this assay, and inadequate number of viral copies (<131 copies/mL). A negative result must be combined with clinical observations, patient history, and epidemiological information. The expected result is Negative. Fact Sheet for Patients:  PinkCheek.be Fact Sheet for Healthcare Providers:  GravelBags.it This test is not yet ap proved or cleared by the [place] FDA and  has been authorized for detection  and/or diagnosis of SARS-CoV-2 by FDA under an Emergency Use Authorization (EUA). This EUA will remain  in effect (meaning this test can be used) for the duration of the COVID-19 declaration under Section 564(b)(1) of the Act, 21 U.S.C. section 360bbb-3(b)(1), unless the authorization is terminated or revoked sooner.    Influenza A by PCR NEGATIVE NEGATIVE Final   Influenza B by PCR NEGATIVE NEGATIVE Final    Comment: (NOTE) The Xpert Xpress SARS-CoV-2/FLU/RSV assay is intended as an aid in  the diagnosis of  influenza from Nasopharyngeal swab specimens and  should not be used as a sole basis for treatment. Nasal washings and  aspirates are unacceptable for Xpert Xpress SARS-CoV-2/FLU/RSV  testing. Fact Sheet for Patients: PinkCheek.be Fact Sheet for Healthcare Providers: GravelBags.it This test is not yet approved or cleared by the [place] FDA and  has been authorized for detection and/or diagnosis of SARS-CoV-2 by  FDA under an Emergency Use Authorization (EUA). This EUA will remain  in effect (meaning this test can be used) for the duration of the  Covid-19 declaration under Section 564(b)(1) of the Act, 21  U.S.C. section 360bbb-3(b)(1), unless the authorization is  terminated or revoked. Performed at [facility], [place] [address] [place], [place] [address]      Labs: BNP (last 3 results) No results for input(s): BNP in the last 8760 hours. Basic Metabolic Panel: Recent Labs  Lab 02/20/20 0938 02/24/20 1258 02/25/20 0517  NA 139 139 140  K 3.2* 3.6 3.2*  CL 101 101 102  CO2 25 25 25   GLUCOSE 136* 153* 128*  BUN 28* 38* 34*  CREATININE 1.50* 1.77* 1.51*  CALCIUM 9.1 8.5* 8.5*   Liver Function Tests: Recent Labs  Lab 02/20/20 0938 02/25/20 0517  AST 25 19  ALT 21 18  ALKPHOS 100 95  BILITOT 1.3* 1.2  PROT 6.7 6.4*  ALBUMIN 3.3* 3.2*   Recent Labs  Lab 02/20/20 0938  LIPASE 19   No results for input(s): AMMONIA in the last 168 hours. CBC: Recent Labs  Lab 02/20/20 0938 02/20/20 1242 02/24/20 1258 02/24/20 1800 02/25/20 0517  WBC 43.2* 40.1* 77.5* 77.1* 66.3*  NEUTROABS  --  24.8*  --  48.6* 41.8*  HGB 8.0* 7.5* 7.2* 7.3* 8.2*  HCT 24.1* 22.2* 22.0* 22.3* 24.1*  MCV 96.4 94.9 97.8 97.8 93.8  PLT 54* 46* 62* 61* 51*   Cardiac Enzymes: No results for input(s): CKTOTAL, CKMB, CKMBINDEX, TROPONINI in the last 168 hours. BNP: Invalid input(s): POCBNP CBG: No results for  input(s): GLUCAP in the last 168 hours. D-Dimer No results for input(s): DDIMER in the last 72 hours. Hgb A1c No results for input(s): HGBA1C in the last 72 hours. Lipid Profile No results for input(s): CHOL, HDL, LDLCALC, TRIG, CHOLHDL, LDLDIRECT in the last 72 hours. Thyroid function studies Recent Labs    02/24/20 1800  TSH 1.856   Anemia work up No results for input(s): VITAMINB12, FOLATE, FERRITIN, TIBC, IRON, RETICCTPCT in the last 72 hours. Urinalysis    Component Value Date/Time   COLORURINE YELLOW 02/24/2020 1432   APPEARANCEUR CLEAR 02/24/2020 1432   LABSPEC 1.010 02/24/2020 1432   PHURINE 5.0 02/24/2020 1432   GLUCOSEU NEGATIVE 02/24/2020 1432   HGBUR SMALL (A) 02/24/2020 1432   BILIRUBINUR NEGATIVE 02/24/2020 1432   KETONESUR NEGATIVE 02/24/2020 1432   PROTEINUR NEGATIVE 02/24/2020 1432   NITRITE NEGATIVE 02/24/2020 1432   LEUKOCYTESUR NEGATIVE 02/24/2020 1432   Sepsis Labs Invalid input(s): PROCALCITONIN,  WBC,  [place] Microbiology Recent Results (from the past 240 hour(s))  Respiratory Panel by RT PCR (Flu A&B, Covid) - Nasopharyngeal Swab     Status: None   Collection Time: 02/24/20  2:32 PM   Specimen: Nasopharyngeal Swab  Result Value Ref Range Status   SARS Coronavirus 2 by RT PCR NEGATIVE NEGATIVE Final    Comment: (NOTE) SARS-CoV-2 target nucleic acids are NOT DETECTED. The SARS-CoV-2 RNA is generally detectable in upper respiratoy specimens during the acute phase of infection. The lowest concentration of SARS-CoV-2 viral copies this assay can detect is 131 copies/mL. A negative result does not preclude SARS-Cov-2 infection and should not be used as the sole basis for treatment or other patient management decisions. A negative result may occur with  improper specimen collection/handling, submission of specimen other than nasopharyngeal swab, presence of viral mutation(s) within the areas targeted by this assay, and inadequate number of viral  copies (<131 copies/mL). A negative result must be combined with clinical observations, patient history, and epidemiological information. The expected result is Negative. Fact Sheet for Patients:  PinkCheek.be Fact Sheet for Healthcare Providers:  GravelBags.it This test is not yet ap proved or cleared by the [place] FDA and  has been authorized for detection and/or diagnosis of SARS-CoV-2 by FDA under an Emergency Use Authorization (EUA). This EUA will remain  in effect (meaning this test can be used) for the duration of the COVID-19 declaration under Section 564(b)(1) of the Act, 21 U.S.C. section 360bbb-3(b)(1), unless the authorization is terminated or revoked sooner.    Influenza A by PCR NEGATIVE NEGATIVE Final   Influenza B by PCR NEGATIVE NEGATIVE Final    Comment: (NOTE) The Xpert Xpress SARS-CoV-2/FLU/RSV assay is intended as an aid in  the diagnosis of influenza from Nasopharyngeal swab specimens and  should not be used as a sole basis for treatment. Nasal washings and  aspirates are unacceptable for Xpert Xpress SARS-CoV-2/FLU/RSV  testing. Fact Sheet for Patients: PinkCheek.be Fact Sheet for Healthcare Providers: GravelBags.it This test is not yet approved or cleared by the [place] FDA and  has been authorized for detection and/or diagnosis of SARS-CoV-2 by  FDA under an Emergency Use Authorization (EUA). This EUA will remain  in effect (meaning this test can be used) for the duration of the  Covid-19 declaration under Section 564(b)(1) of the Act, 21  U.S.C. section 360bbb-3(b)(1), unless the authorization is  terminated or revoked. Performed at [facility], [place] [address] [place], [place] [address]      Time coordinating discharge: 35 minutes  SIGNED:   Cordelia Poche, MD [facility] 02/25/2020, 2:25 PM

## 2020-02-25 NOTE — Discharge Instructions (Signed)
Hospice Hospice is a service that is designed to provide people who are terminally ill and their families with medical, spiritual, and psychological support. Its aim is to improve your quality of life by keeping you as comfortable as possible in the final stages of life. Who will be my providers when I begin hospice care? Hospice teams often include:  A nurse.  A doctor. The hospice doctor will be available for your care, but you can include your regular doctor or nurse practitioner.  A social worker.  A counselor.  A religious leader (such as a chaplain).  A dietitian.  Therapists.  Trained volunteers who can help with care. What services does hospice provide? Hospice services can vary depending on the center or organization. Generally, they include:  Ways to keep you comfortable, such as: ? Providing care in your home or in a home-like setting. ? Working with your family and friends to help meet your needs. ? Allowing you to enjoy the support of loved ones by receiving much of your basic care from family and friends.  Pain relief and symptom management. The staff will supply all necessary medicines and equipment so that you can stay comfortable and alert enough to enjoy the company of your friends and family.  Visits or care from a nurse and doctor. This may include 24-hour on-call services.  Companionship when you are alone.  Allowing you and your family to rest. Hospice staff may do light housekeeping, prepare meals, and run errands.  Counseling. They will make sure your emotional, spiritual, and social needs are being met, as well as those needs of your family members.  Spiritual care. This will be individualized to meet your needs and your family's needs. It may involve: ? Helping you and your family understand the dying process. ? Helping you say goodbye to your family and friends. ? Performing a specific religious ceremony or ritual.  Massage.  Nutrition  therapy.  Physical and occupational therapy.  Short-term inpatient care, if something cannot be managed in the home.  Art or music therapy.  Bereavement support for grieving family members. When should hospice care begin? Most people who use hospice are believed to have less than 6 months to live.  Your family and health care providers can help you decide when hospice services should begin.  If you live longer than 6 months but your condition does not improve, your doctor may be able to approve you for continued hospice care.  If your condition improves, you may discontinue the program. What should I consider before selecting a program? Most hospice programs are run by nonprofit, independent organizations. Some are affiliated with hospitals, nursing homes, or home health care agencies. Hospice programs can take place in your home or at a hospice center, hospital, or skilled nursing facility. When choosing a hospice program, ask the following questions:  What services are available to me?  What services will be offered to my loved ones?  How involved will my loved ones be?  How involved will my health care provider be?  Who makes up the hospice care team? How are they trained or screened?  How will my pain and symptoms be managed?  If my circumstances change, can the services be provided in a different setting, such as my home or in the hospital?  Is the program reviewed and licensed by the state or certified in some other way?  What does it cost? Is it covered by insurance?  If I choose a hospice   center or nursing home, where is the hospice center located? Is it convenient for family and friends?  If I choose a hospice center or nursing home, can my family and friends visit any time?  Will you provide emotional and spiritual support?  Who can my family call with questions? Where can I learn more about hospice? You can learn about existing hospice programs in your area  from your health care providers. You can also read more about hospice online. The websites of the following organizations have helpful information:  [facility] and Palliative Care Organization [facility]): http://www.brown-buchanan.com/  National Association for [facility]): http://massey-hart.com/  Hospice Foundation of [place]): www.hospicefoundation.org  American Cancer Society (ACS): www.cancer.org  Hospice Net: www.hospicenet.org  Visiting Nurse Associations of [place] (VNAA): www.vnaa.org You may also find more information by contacting the following agencies:  A local agency on aging.  Your local [employer] chapter.  Your state's department of health or social services. Summary  Hospice is a service that is designed to provide people who are terminally ill and their families with medical, spiritual, and psychological support.  Hospice aims to improve your quality of life by keeping you as comfortable as possible in the final stages of life.  Hospice teams often include a doctor, nurse, social worker, counselor, religious leader,dietitian, therapists, and volunteers.  Hospice care generally includes medicine for symptom management, visits from doctors and nurses, physical and occupational therapy, nutrition counseling, spiritual and emotional counseling, caregiver support, and bereavement support for grieving family members.  Hospice programs can take place in your home or at a hospice center, hospital, or skilled nursing facility. This information is not intended to replace advice given to you by your health care provider. Make sure you discuss any questions you have with your health care provider. Document Revised: 07/07/2019 Document Reviewed: 11/05/2016 Elsevier Patient Education  [place].

## 2020-02-25 NOTE — Progress Notes (Signed)
My assessment and recommendations were discussed with the patient today face-to-face.  This was also discussed with his daughter that was present at bedside.  Patient expressed a clear wishes of not to proceed with any life prolonging measures to focus on comfort and pain control.  His main concern is controlling his chronic pain which is not cancer related.  Input from a palliative medicine service will be helpful.   Please call with any questions regarding.   25  minutes were dedicated to this visit.  More than 50% of the time was spent face-to-face with the patient and his daughter discussing his disease status, overall prognosis and future management.

## 2020-02-25 NOTE — TOC Transition Note (Signed)
Transition of Care [facility]) - CM/SW Discharge Note   Patient Details  Name: Terry Sullivan MRN: [id number] Date of Birth: 06-May-1924  Transition of Care [facility]) CM/SW Contact:  Trish Mage, LCSW Phone Number: 02/25/2020, 10:03 AM   Clinical Narrative:  Met with patient and daughter, Terry Sullivan,  at bedside in follow up to MD consult for hospice referral.  Patient lives at home alone, was getting [facility] PT services through [place], has DME of cane and walker, and PCS for limited number of hours.  Family agreed to referral to [place] for services, referral made to [facility]. Likely d/c today back to the home.  TOC will continue to follow during the course of hospitalization.     Final next level of care: Home w Hospice Care Barriers to Discharge: No Barriers Identified   Patient Goals and CMS Choice        Discharge Placement                       Discharge Plan and Services                                     Social Determinants of Health (SDOH) Interventions     Readmission Risk Interventions No flowsheet data found.

## 2020-02-25 NOTE — Progress Notes (Signed)
Events overnight noted.  Patient known to me with history of bladder cancer and recent diagnosis of myelodysplastic syndrome.  He presented with failure to thrive leukocytosis and worsening anemia.  His CBC reviewed and continues to show rapid increase in his white cell count with monocytosis, worsening anemia and thrombocytopenia.  His differential showed atypical monocytes with atypical morphology.  This could be a sign of progression into possibly acute leukemia.  This is in the setting of overall clinical decline.  Recommendation:  I agree with packed red cell transfusion to keep his hemoglobin above 8 at least for the time being.  His hemoglobin is adequate and I do not recommend any further transfusion.  Consider discontinuation of Eliquis given his thrombocytopenia and increased risk of bleeding.  His prognosis is poor with limited life expectancy.  I agree with DNR and transitioning into hospice upon discharge.  We will continue to follow his progress with you.

## 2020-02-25 NOTE — Plan of Care (Signed)
Pt was able to explain why he had to get a blood transfusion / anemia. Pt will know to report any signs of blood loss either in stool or visualizations

## 2020-02-25 NOTE — Progress Notes (Signed)
Manufacturing engineer [facility])  Received request from [facility] for hospice services at home after discharge.  Chart and pt information under review by [facility] physician.  Hospice eligibility pending at this time.  Hospital liaison spoke with Tye Maryland, pt's daughter, to initiate education related to hospice philosophy and services and to answer any questions at this time.  Tye Maryland verbalized understanding of information given.  Per discussion the plan is to discharge home today by private vehicle.    [place] send signed and completed DNR home with pt/family.  Please provide prescriptions at discharge as needed to ensure ongoing symptom management.    DME needs discussed.  Family denies any DME needs at this time.  Address has been verified and is correct in the chart.  ACC information and contact numbers given to [facility].  Above information shared with [employer] Manager.  Please call with any questions or concerns.  Thank you for the opportunity to participate in this pt's care.  Domenic Moras, BSN, RN [employer] [phone] (24h on call)

## 2020-02-25 NOTE — Progress Notes (Signed)
Patient and son given discharge instructions, and verbalized an understanding of all discharge instructions.  Patient and son agrees with discharge plan, and is being discharged in stable medical condition.  Patient given transportation via wheelchair.

## 2020-02-26 LAB — BPAM RBC
Blood Product Expiration Date: 202106012359
ISSUE DATE / TIME: 202104291727
Unit Type and Rh: 9500

## 2020-02-26 LAB — TYPE AND SCREEN
ABO/RH(D): O NEG
Antibody Screen: NEGATIVE
Unit division: 0

## 2020-02-29 ENCOUNTER — Other Ambulatory Visit: Payer: Medicare Other

## 2020-03-01 NOTE — Telephone Encounter (Signed)
From pt

## 2020-03-02 NOTE — Telephone Encounter (Signed)
Received MyChart message regarding discontinuation of Eliquis from the patient, I reviewed his chart, completely agree that he should come off of Eliquis.  Patient is also being considered for hospice, his cardioembolic risk is also fairly low and he continues to have myelodysplastic syndrome needing transfusion and thrombocytopenia.

## 2020-03-06 ENCOUNTER — Encounter: Payer: Self-pay | Admitting: Radiology

## 2020-03-06 ENCOUNTER — Telehealth: Payer: Self-pay

## 2020-03-08 NOTE — Telephone Encounter (Signed)
Please see above

## 2020-03-22 ENCOUNTER — Ambulatory Visit: Payer: Medicare Other | Admitting: Oncology

## 2020-03-22 ENCOUNTER — Other Ambulatory Visit: Payer: Medicare Other

## 2020-03-28 DEATH — deceased

## 2020-04-04 ENCOUNTER — Other Ambulatory Visit: Payer: Medicare Other

## 2020-04-06 ENCOUNTER — Ambulatory Visit: Payer: Medicare Other | Admitting: Cardiology

## 2020-04-11 ENCOUNTER — Ambulatory Visit: Payer: Medicare Other | Admitting: Oncology

## 2020-05-08 ENCOUNTER — Ambulatory Visit: Payer: Medicare Other | Admitting: Cardiology

## 2020-08-15 IMAGING — DX DG CHEST 1V PORT
1 series · 1 of 1 positions shown · non-contrast
Comparison: 10/27/2013.

CLINICAL DATA: [AGE] with shortness of breath over the past
several weeks, who currently has atrial fibrillation with rapid
ventricular response.

EXAM:
PORTABLE CHEST 1 VIEW

[chest ap]
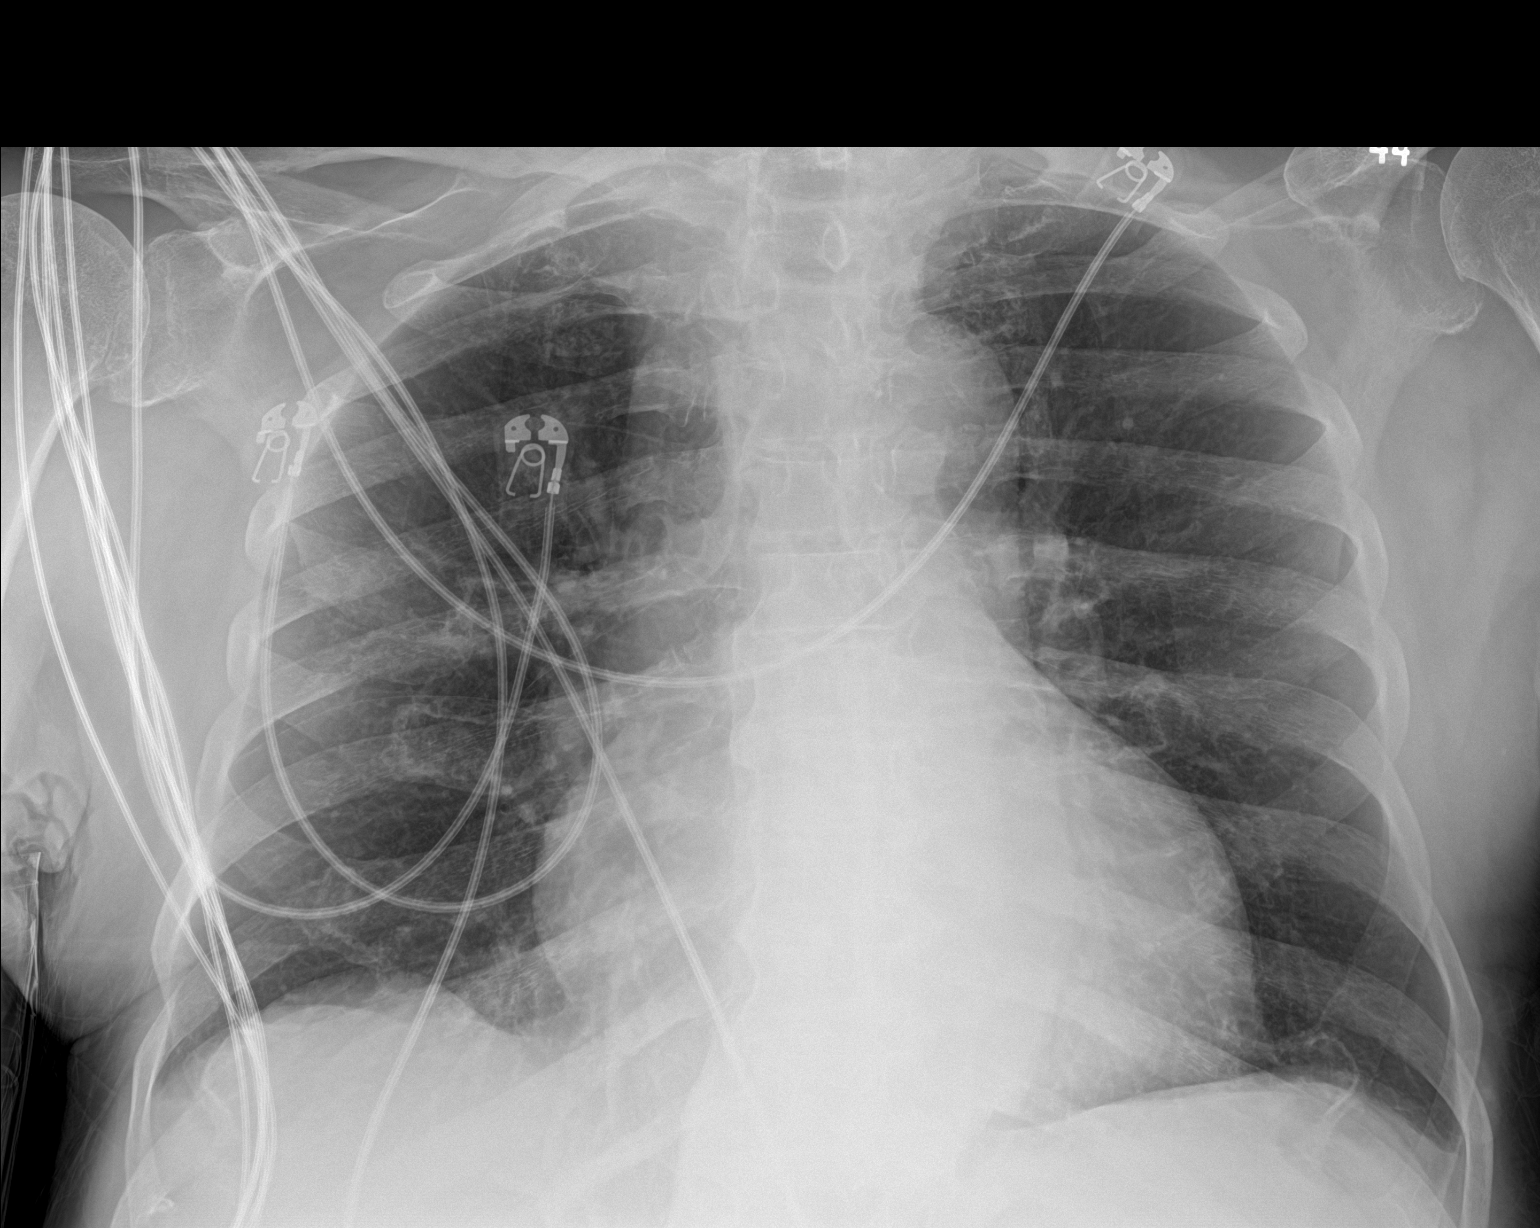

[1 of 1 positions shown; findings below may reference images not displayed]

FINDINGS: Cardiac silhouette mildly enlarged for AP portable technique. Lungs
clear. Bronchovascular markings normal. Pulmonary vascularity
normal. No visible pleural effusions. No pneumothorax.
IMPRESSION: Mild cardiomegaly. No acute cardiopulmonary disease.

## 2020-09-06 IMAGING — CT CT BIOPSY
1 of 2 series · 15 of 28 positions shown, 19 images · non-contrast
Comparison: none

INDICATION: [AGE] male with anemia and thrombocytopenia. He presents for
CT-guided bone marrow biopsy.

[Series 2: i-spiral 5.0 b40f · axial · 0.98mm/px · z∈[-46,+27]mm · 15 of 25 slices shown, 19 images]
[im 2/25  mediastinal]
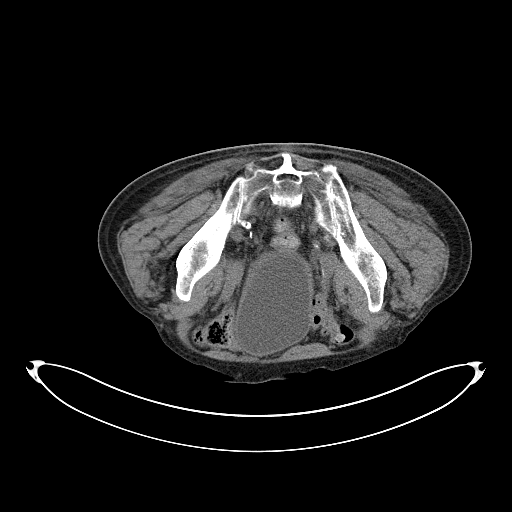
[im 2/25  lung]
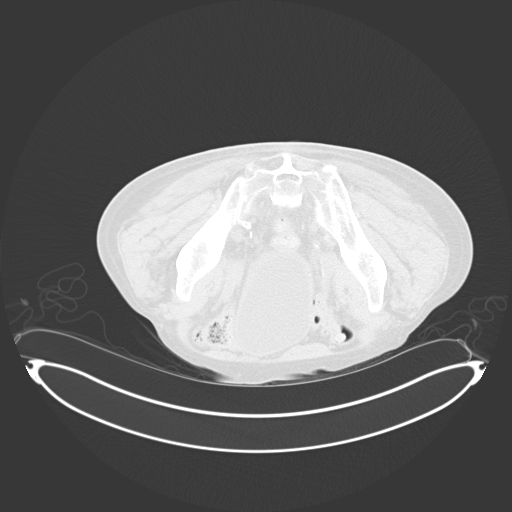
[im 4/25  lung]
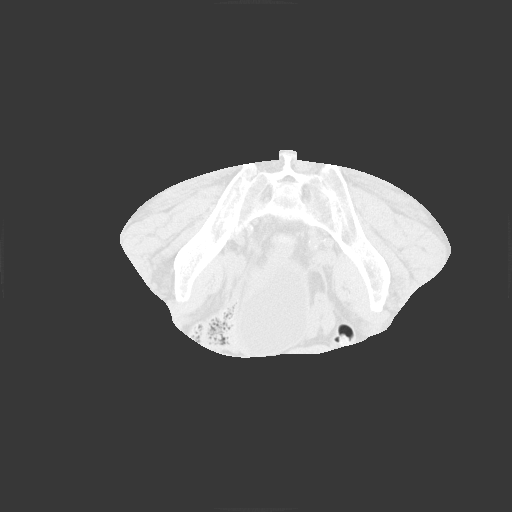
[im 5/25  lung]
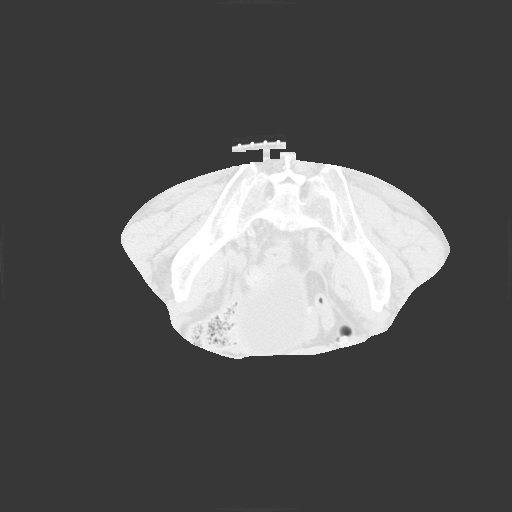
[im 6/25  lung]
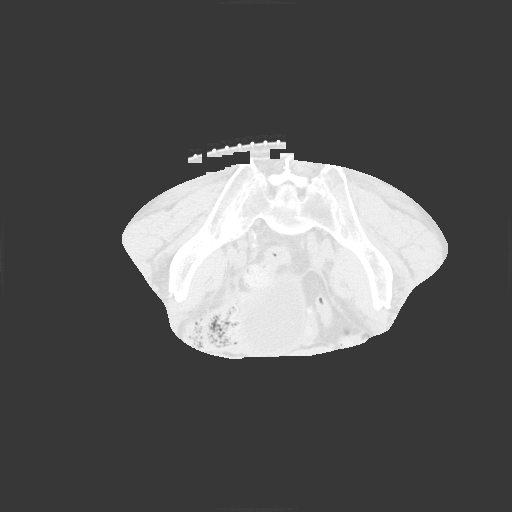
[im 8/25  mediastinal]
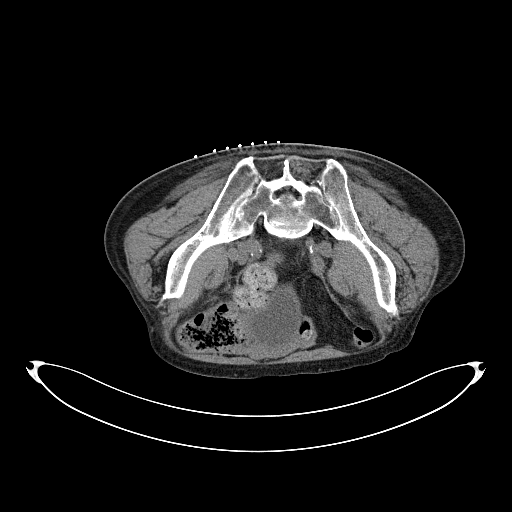
[im 8/25  lung]
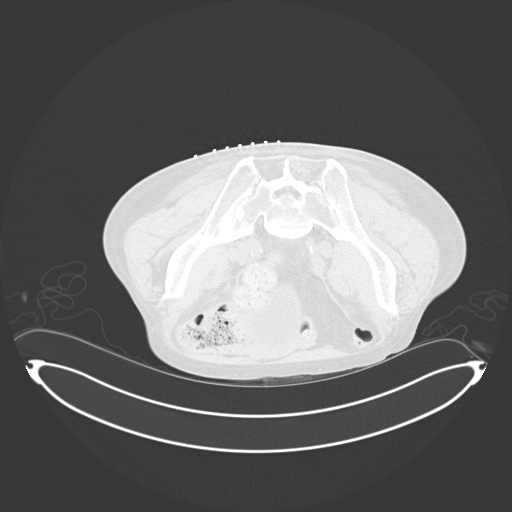
[im 9/25  lung]
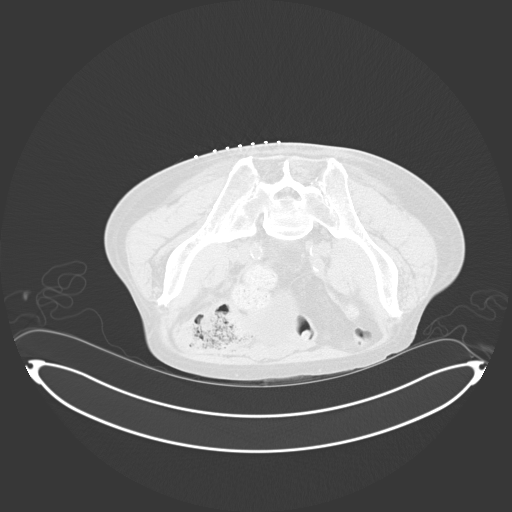
[im 11/25  lung]
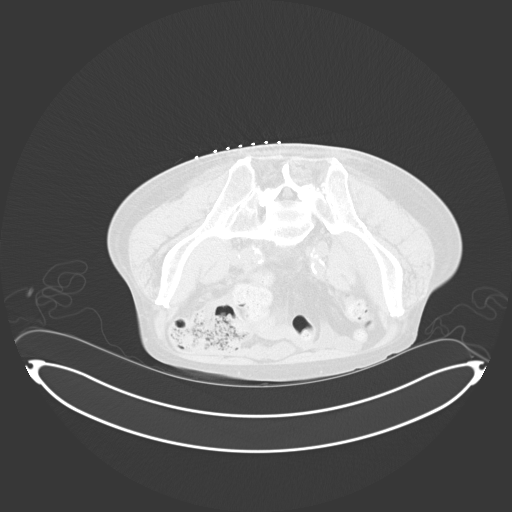
[im 13/25  lung]
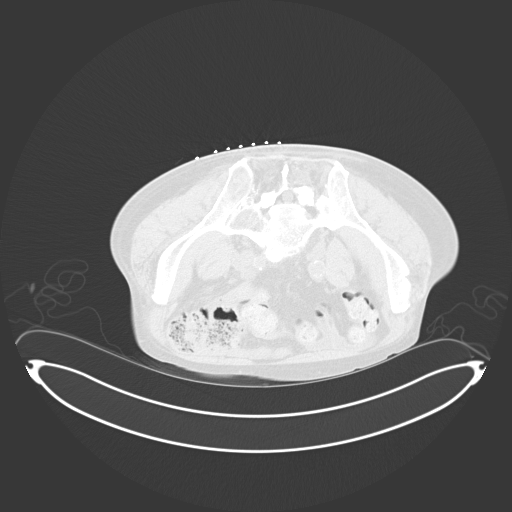
[im 14/25  mediastinal]
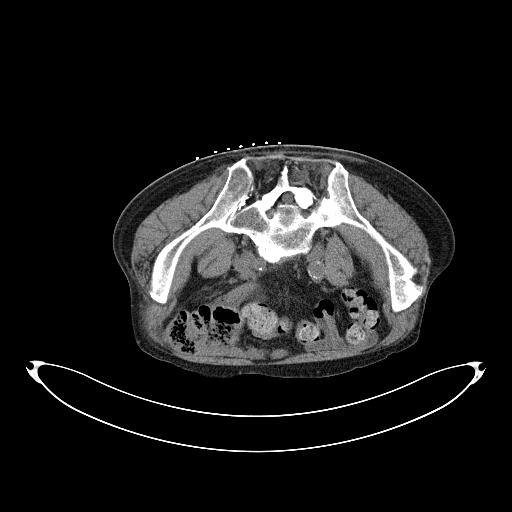
[im 14/25  lung]
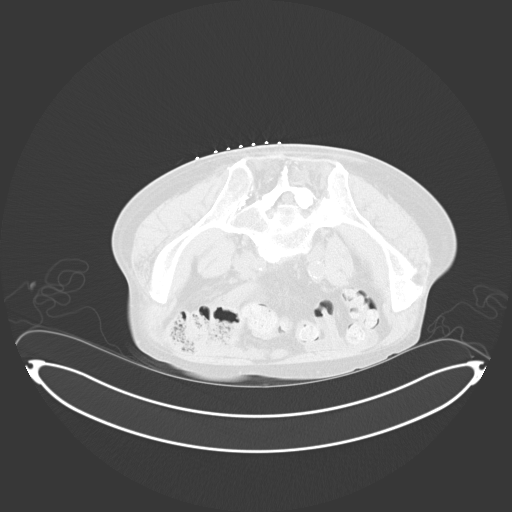
[im 16/25  lung]
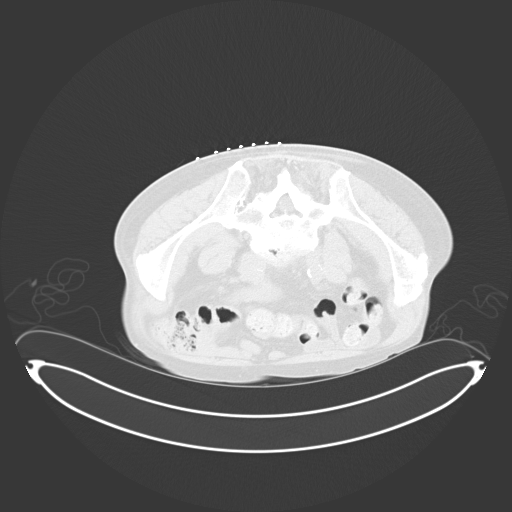
[im 17/25  lung]
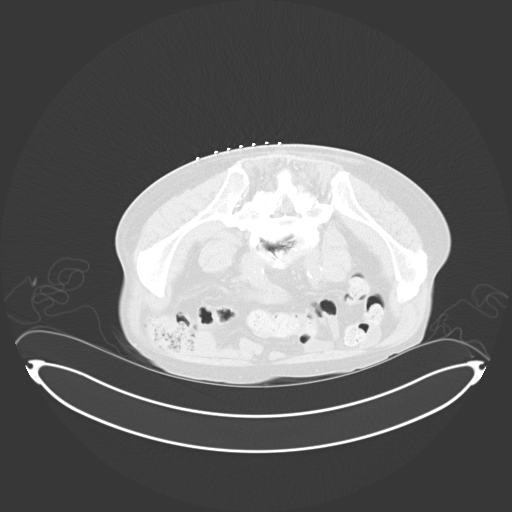
[im 19/25  lung]
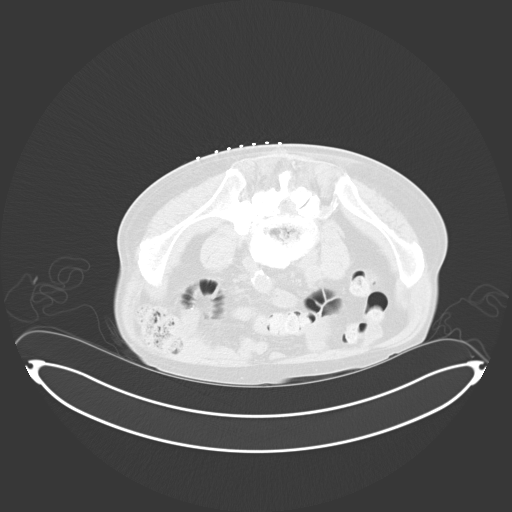
[im 20/25  mediastinal]
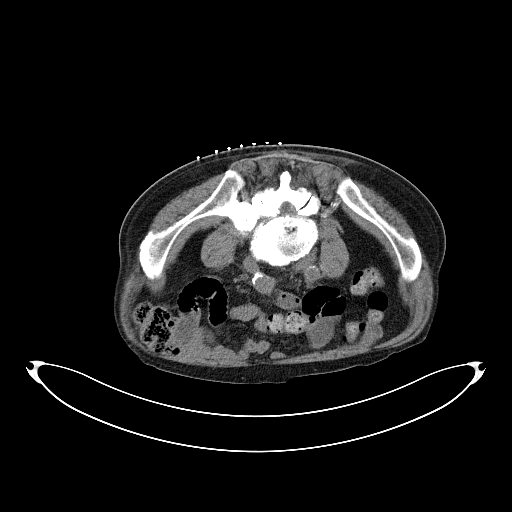
[im 20/25  lung]
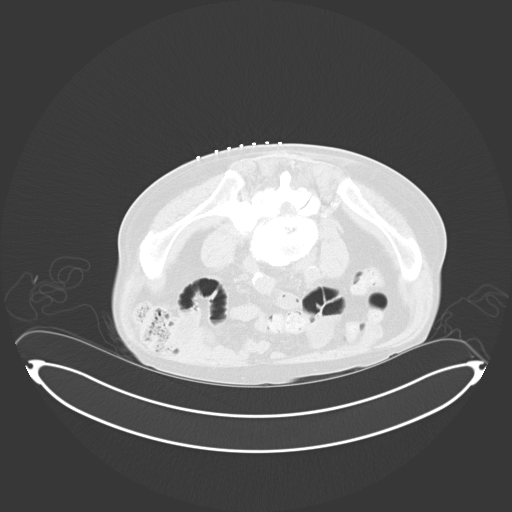
[im 21/25  lung]
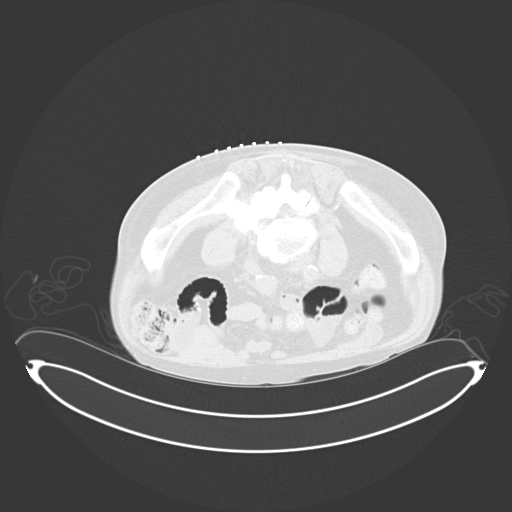
[im 23/25  lung]
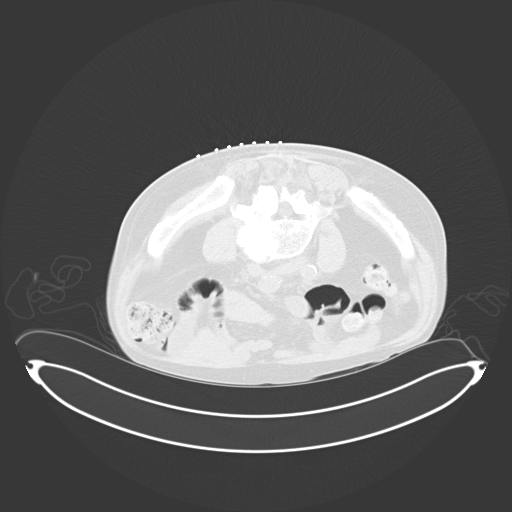

[15 of 28 positions shown; findings below may reference images not displayed]

EXAM:
CT GUIDED BONE MARROW ASPIRATION AND CORE BIOPSY

MEDICATIONS:
None.

ANESTHESIA/SEDATION:
Moderate (conscious) sedation was employed during this procedure. A
total of 0.5 milligrams versed and 50 micrograms fentanyl were
administered intravenously. The patient's level of consciousness and
vital signs were monitored continuously by radiology nursing
throughout the procedure under my direct supervision.

Total monitored sedation time: 10 minutes

FLUOROSCOPY TIME:  None

COMPLICATIONS:
None immediate.

Estimated blood loss: <25 mL

PROCEDURE:
Informed written consent was obtained from the patient after a
thorough discussion of the procedural risks, benefits and
alternatives. All questions were addressed. Maximal Sterile Barrier
Technique was utilized including caps, mask, sterile gowns, sterile
gloves, sterile drape, hand hygiene and skin antiseptic. A timeout
was performed prior to the initiation of the procedure.

The patient was positioned prone and non-contrast localization CT
was performed of the pelvis to demonstrate the iliac marrow spaces.

Maximal barrier sterile technique utilized including caps, mask,
sterile gowns, sterile gloves, large sterile drape, hand hygiene,
and betadine prep.

Under sterile conditions and local anesthesia, an 11 gauge coaxial
bone biopsy needle was advanced into the right iliac marrow space.
Needle position was confirmed with CT imaging. Initially, bone
marrow aspiration was performed. Next, the 11 gauge outer cannula
was utilized to obtain a right iliac bone marrow core biopsy. The on
control drill device was used during the procedure. Needle was
removed. Hemostasis was obtained with compression. The patient
tolerated the procedure well. Samples were prepared with the
cytotechnologist.
IMPRESSION: Successful right iliac bone marrow aspiration and core biopsy.

## 2020-10-03 IMAGING — CT CT ABD-PELV W/ CM
2 of 5 series · 16 of 46 positions shown, 18 images · IV contrast (omnipaque)
Comparison: 09/30/2019

CLINICAL DATA: Abdominal distension, bowel obstruction suspected,
constipation for 8 days, elevated WBC, history of bladder cancer

EXAM:
CT ABDOMEN AND PELVIS WITH CONTRAST
TECHNIQUE: Multidetector CT imaging of the abdomen and pelvis was performed
using the standard protocol following bolus administration of
intravenous contrast.
CONTRAST:  75mL OMNIPAQUE IOHEXOL 300 MG/ML  SOLN

[Series 3: abdomen 5.0 · axial · 0.75mm/px · z∈[+768,+1153]mm · 13 of 89 slices shown, 15 images]
[im 6/89  soft-tissue]
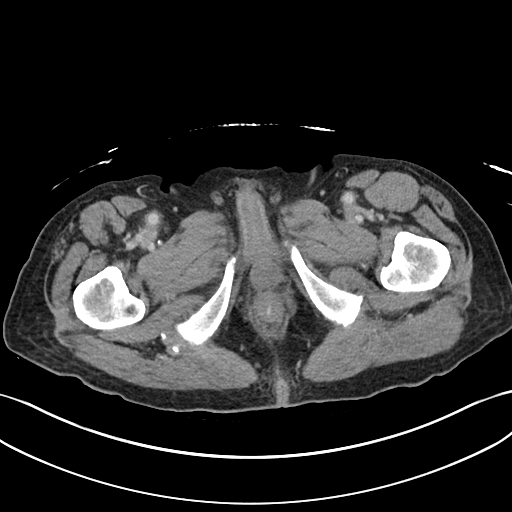
[im 6/89  bone]
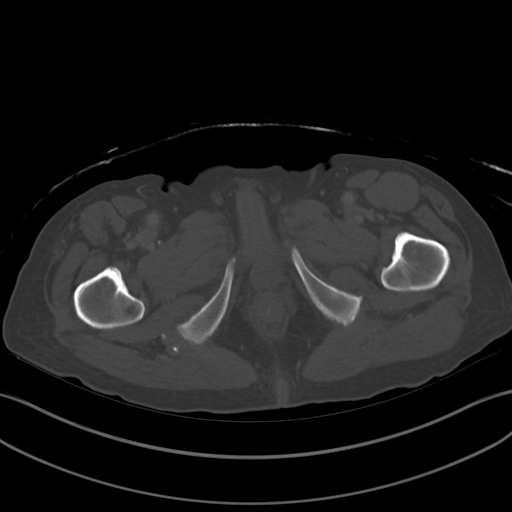
[im 12/89  soft-tissue]
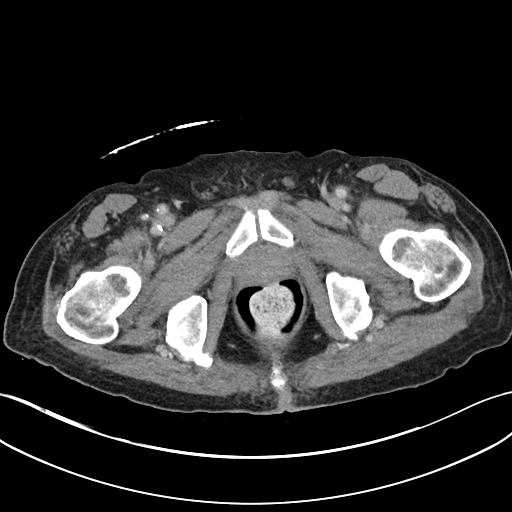
[im 18/89  soft-tissue]
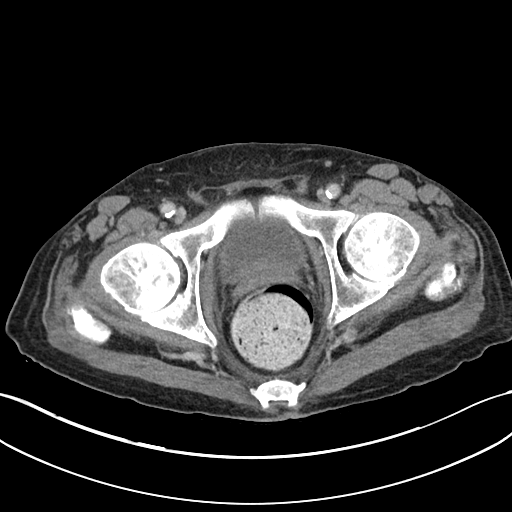
[im 24/89  soft-tissue]
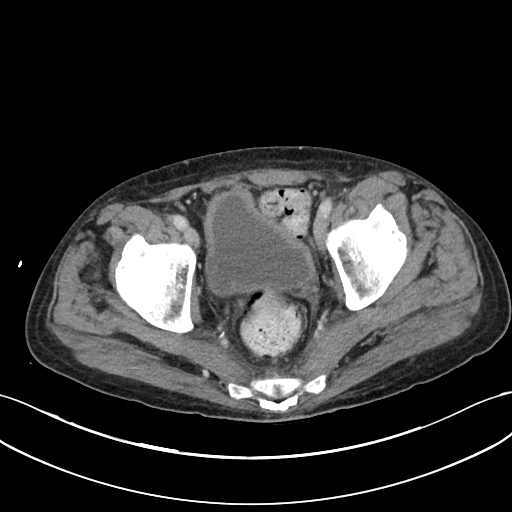
[im 30/89  soft-tissue]
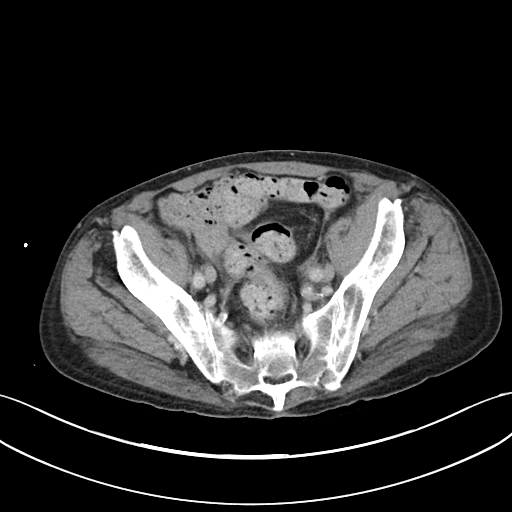
[im 36/89  soft-tissue]
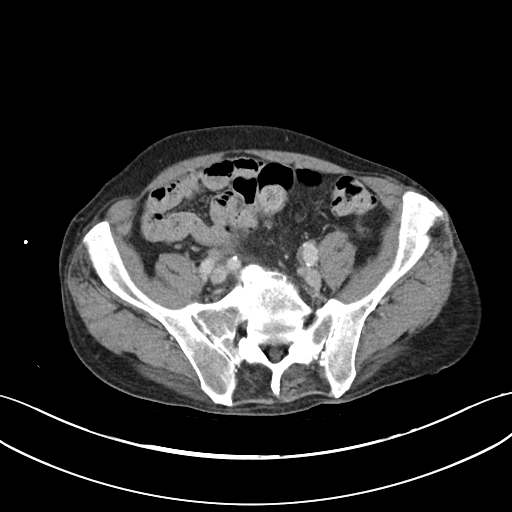
[im 47/89  soft-tissue]
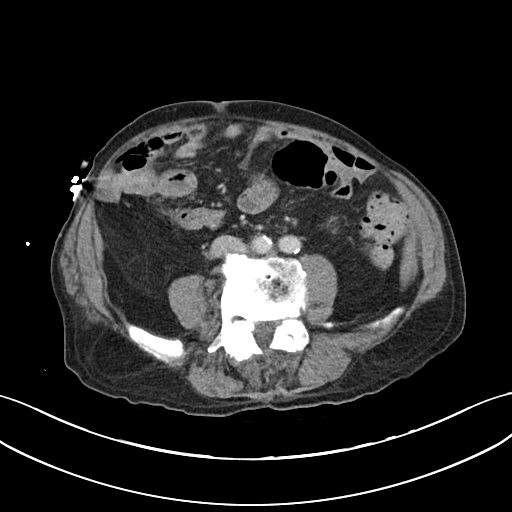
[im 53/89  soft-tissue]
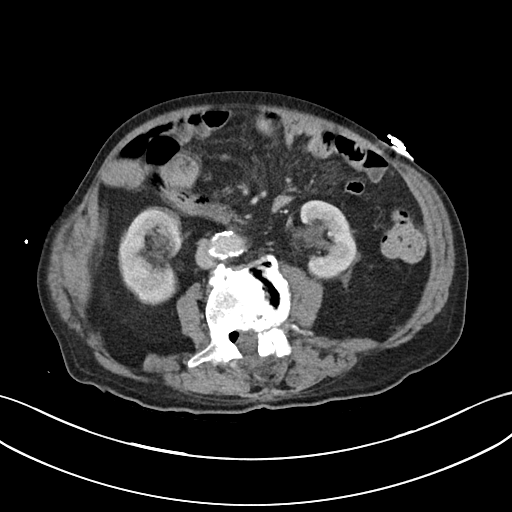
[im 59/89  soft-tissue]
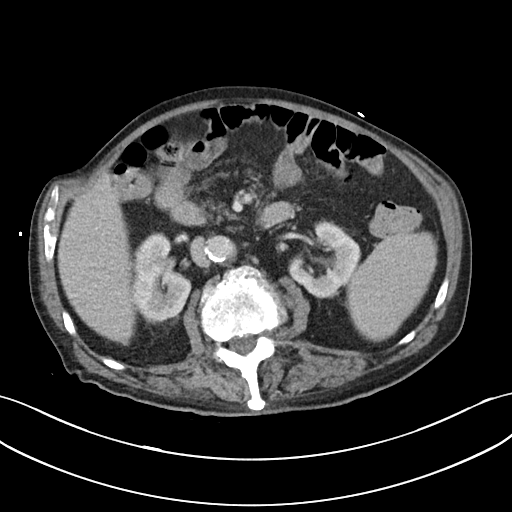
[im 59/89  bone]
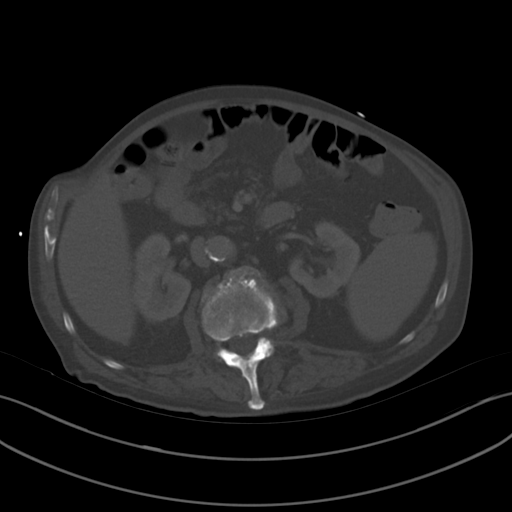
[im 65/89  soft-tissue]
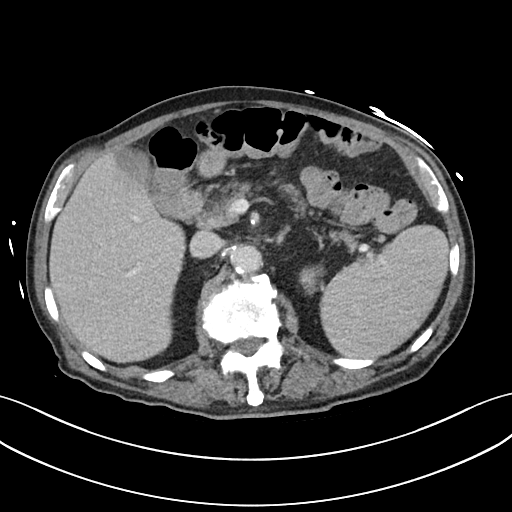
[im 71/89  soft-tissue]
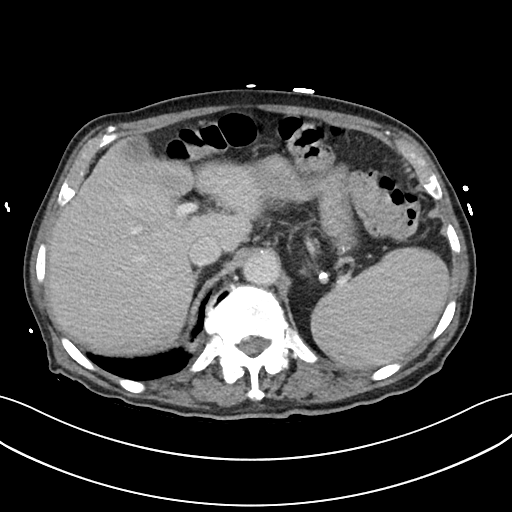
[im 77/89  soft-tissue]
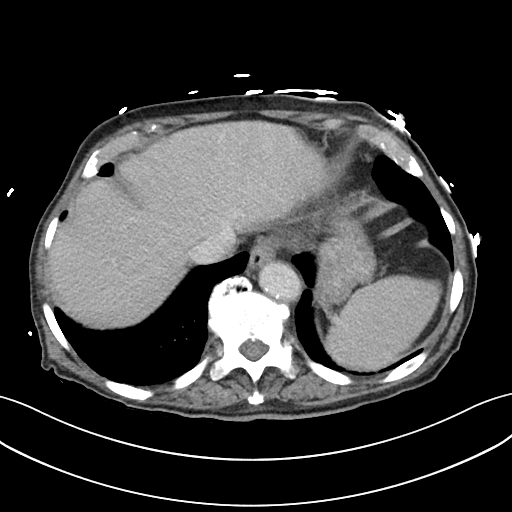
[im 83/89  soft-tissue]
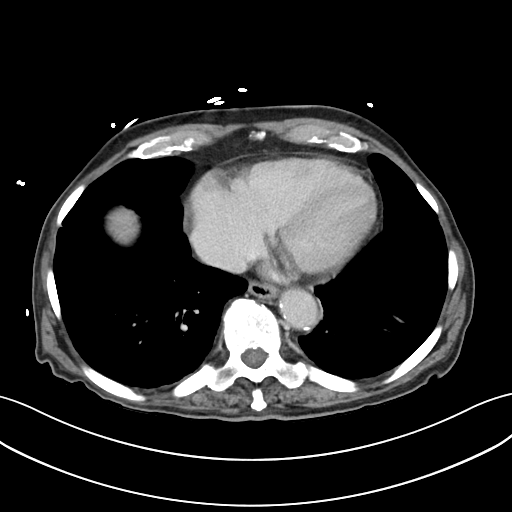

[Series 6: abdomen 3.0 mpr cor · coronal · 0.74mm/px · 3 of 82 slices shown]
[im 28/82  soft-tissue]
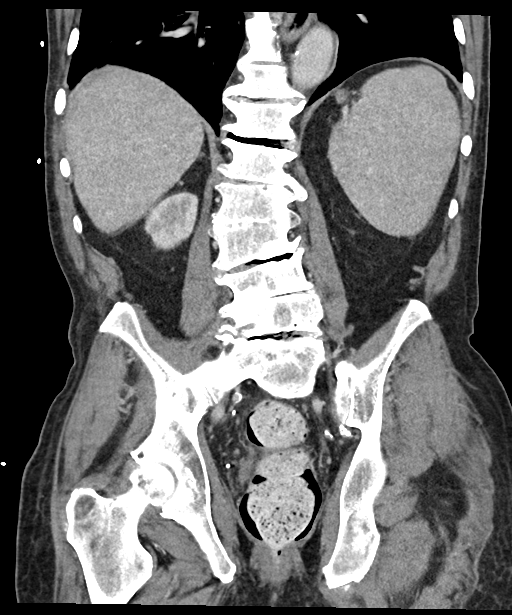
[im 37/82  soft-tissue]
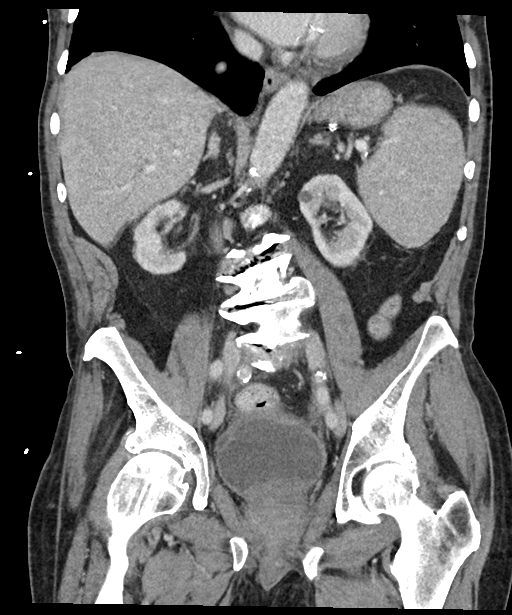
[im 46/82  soft-tissue]
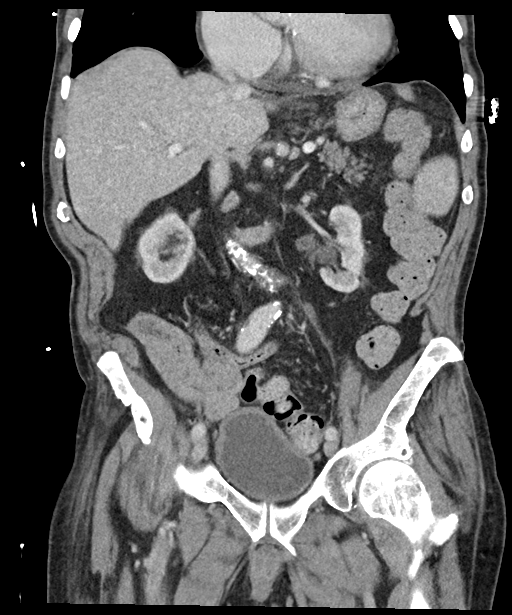

[16 of 46 positions shown; findings below may reference images not displayed]

FINDINGS: Lower chest: No acute abnormality. Cardiomegaly. Coronary artery
calcifications.

Hepatobiliary: No solid liver abnormality is seen. No gallstones,
gallbladder wall thickening, or biliary dilatation.

Pancreas: Unremarkable. No pancreatic ductal dilatation or
surrounding inflammatory changes.

Spleen: Normal in size without significant abnormality.

Adrenals/Urinary Tract: Adrenal glands are unremarkable. Kidneys are
normal, without renal calculi, solid lesion, or hydronephrosis.
Bladder is unremarkable.

Stomach/Bowel: Stomach is within normal limits. Small incidental
diverticulum of the descending duodenum. The appendix is not clearly
visualized. Generally moderate burden of stool throughout the colon
with large stool balls in the rectum.

Vascular/Lymphatic: Aortic atherosclerosis. No enlarged abdominal or
pelvic lymph nodes.

Reproductive: No mass or other significant abnormality.

Other: No abdominal wall hernia or abnormality. No abdominopelvic
ascites.

Musculoskeletal: No acute or significant osseous findings. Severe
disc degenerative disease and osteophytosis of the lumbar spine.
IMPRESSION: 1.  No evidence of bowel obstruction.

2. Large stool balls in the rectum with a generally moderate burden
of stool throughout the colon otherwise.

3. No evidence of malignancy in the abdomen or pelvis per reported
history of bladder cancer.

4.  Coronary artery disease.  Aortic Atherosclerosis ([id number]).
